# Patient Record
Sex: Female | Born: 1990
Health system: Southern US, Community
[De-identification: ages and names within clinical notes are randomized; demographics above are authoritative.]

## PROBLEM LIST (undated history)

## (undated) DIAGNOSIS — O039 Complete or unspecified spontaneous abortion without complication: Secondary | ICD-10-CM

## (undated) DIAGNOSIS — N200 Calculus of kidney: Secondary | ICD-10-CM

## (undated) DIAGNOSIS — N898 Other specified noninflammatory disorders of vagina: Secondary | ICD-10-CM

## (undated) DIAGNOSIS — O24419 Gestational diabetes mellitus in pregnancy, unspecified control: Secondary | ICD-10-CM

## (undated) DIAGNOSIS — G43009 Migraine without aura, not intractable, without status migrainosus: Secondary | ICD-10-CM

## (undated) HISTORY — DX: Complete or unspecified spontaneous abortion without complication: O03.9

## (undated) HISTORY — DX: Migraine without aura, not intractable, without status migrainosus: G43.009

## (undated) HISTORY — DX: Other specified noninflammatory disorders of vagina: N89.8

## (undated) HISTORY — PX: THYROIDECTOMY, PARTIAL: SHX18

## (undated) HISTORY — DX: Calculus of kidney: N20.0

---
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
## 2009-09-02 DIAGNOSIS — O039 Complete or unspecified spontaneous abortion without complication: Secondary | ICD-10-CM

## 2009-09-02 HISTORY — DX: Complete or unspecified spontaneous abortion without complication: O03.9

## 2011-03-26 ENCOUNTER — Ambulatory Visit: Payer: Self-pay

## 2011-04-06 ENCOUNTER — Ambulatory Visit: Payer: Self-pay

## 2011-04-09 ENCOUNTER — Observation Stay: Payer: Self-pay | Admitting: Advanced Practice Midwife

## 2011-05-10 ENCOUNTER — Inpatient Hospital Stay: Payer: Self-pay

## 2011-05-10 DIAGNOSIS — O24419 Gestational diabetes mellitus in pregnancy, unspecified control: Secondary | ICD-10-CM

## 2011-05-10 LAB — CBC WITH DIFFERENTIAL/PLATELET
Basophil #: 0 10*3/uL (ref 0.0–0.1)
Basophil %: 0.2 %
Eosinophil #: 0 10*3/uL (ref 0.0–0.7)
Eosinophil %: 0.3 %
HCT: 33.2 % — ABNORMAL LOW (ref 35.0–47.0)
HGB: 11.1 g/dL — ABNORMAL LOW (ref 12.0–16.0)
Lymphocyte #: 1.7 10*3/uL (ref 1.0–3.6)
Lymphocyte %: 14.3 %
MCH: 30.9 pg (ref 26.0–34.0)
MCHC: 33.3 g/dL (ref 32.0–36.0)
MCV: 93 fL (ref 80–100)
Monocyte #: 0.7 10*3/uL (ref 0.0–0.7)
Monocyte %: 5.6 %
Neutrophil #: 9.3 10*3/uL — ABNORMAL HIGH (ref 1.4–6.5)
Neutrophil %: 79.6 %
Platelet: 183 10*3/uL (ref 150–440)
RBC: 3.58 10*6/uL — ABNORMAL LOW (ref 3.80–5.20)
RDW: 13.3 % (ref 11.5–14.5)
WBC: 11.7 10*3/uL — ABNORMAL HIGH (ref 3.6–11.0)

## 2011-05-11 LAB — HEMATOCRIT: HCT: 29.4 % — ABNORMAL LOW (ref 35.0–47.0)

## 2011-05-15 LAB — PATHOLOGY REPORT

## 2012-04-16 ENCOUNTER — Other Ambulatory Visit: Payer: Self-pay

## 2012-04-16 LAB — CREATININE, SERUM
Creatinine: 0.85 mg/dL (ref 0.60–1.30)
EGFR (African American): >60
EGFR (Non-African Amer.): >60

## 2012-04-16 LAB — HCG, QUANTITATIVE, PREGNANCY: Beta Hcg, Quant.: <1 m[IU]/mL — ABNORMAL LOW

## 2012-04-17 ENCOUNTER — Ambulatory Visit: Payer: Self-pay | Admitting: Otolaryngology

## 2012-05-02 ENCOUNTER — Ambulatory Visit: Payer: Self-pay | Admitting: Otolaryngology

## 2012-06-25 ENCOUNTER — Ambulatory Visit: Payer: Self-pay | Admitting: Otolaryngology

## 2014-06-02 IMAGING — CT CT NECK WITH CONTRAST
2 series · 10 of 14 positions shown, 12 images · IV contrast (agent unspecified)
Comparison: none

REASON FOR EXAM: Neck mass
COMMENTS:

PROCEDURE:     CT  - CT NECK WITH CONTRAST  - April 17, 2012  [DATE]
RESULT:     History: Neck mass.
Comparison Study: No recent.

[Series 2: soft tissue · axial · 0.47mm/px · z∈[+196,+418]mm · 8 of 96 slices shown, 10 images]
[im 11/96  soft-tissue]
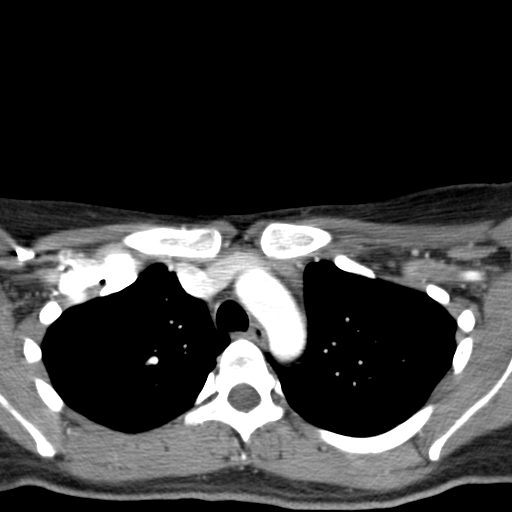
[im 11/96  bone]
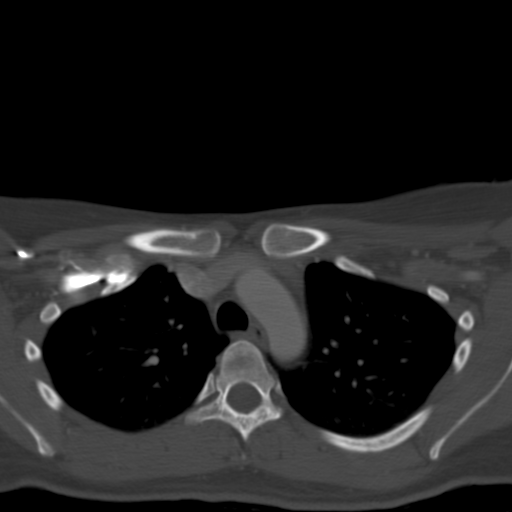
[im 22/96  bone]
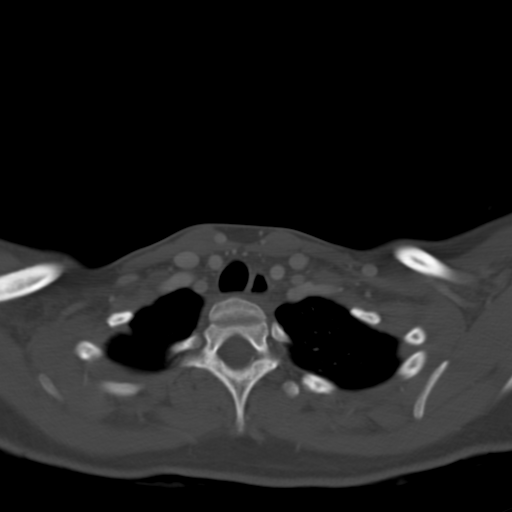
[im 32/96  bone]
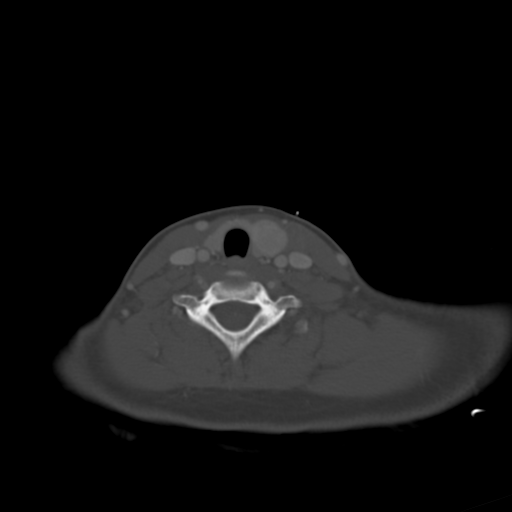
[im 43/96  bone]
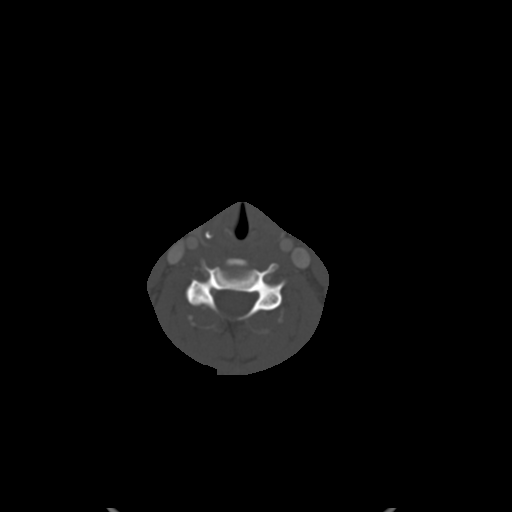
[im 53/96  soft-tissue]
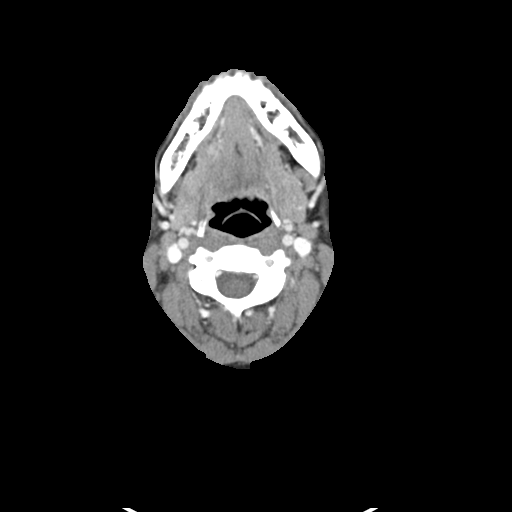
[im 53/96  bone]
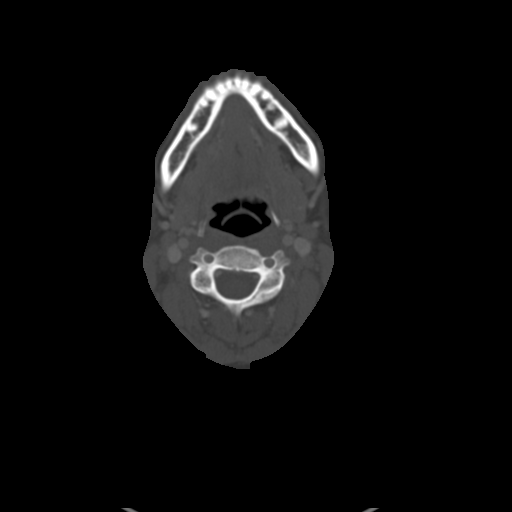
[im 64/96  bone]
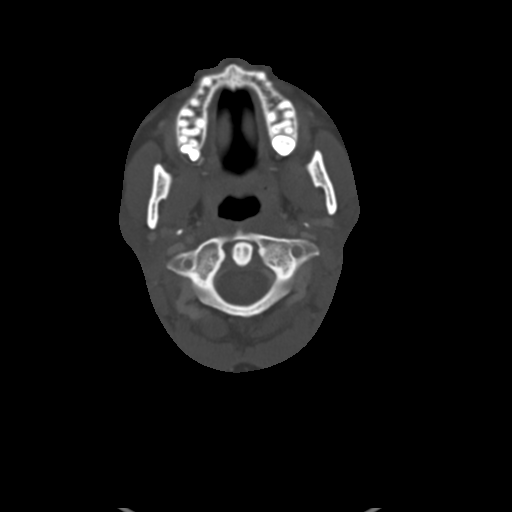
[im 74/96  bone]
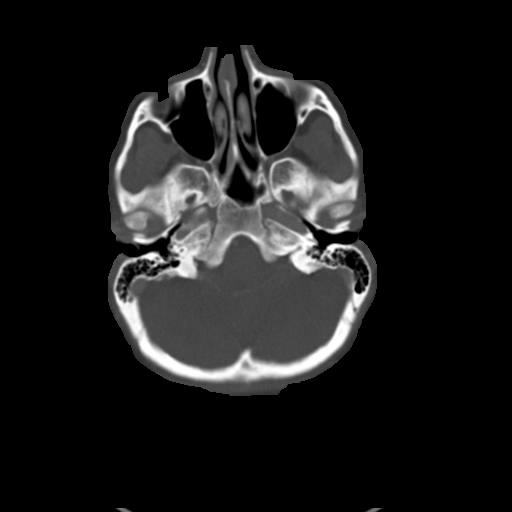
[im 85/96  bone]
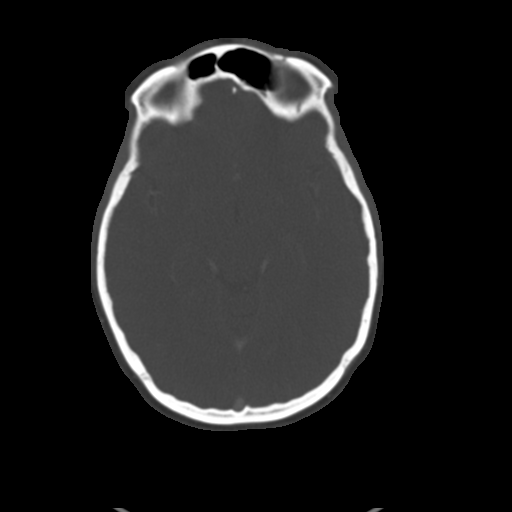

[Series 4: lung windows · axial · 0.66mm/px · z∈[+196,+230]mm · 2 of 33 slices shown]
[im 11/33  bone]
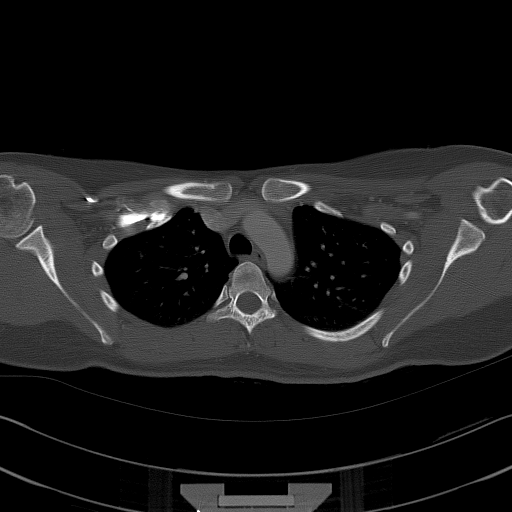
[im 22/33  bone]
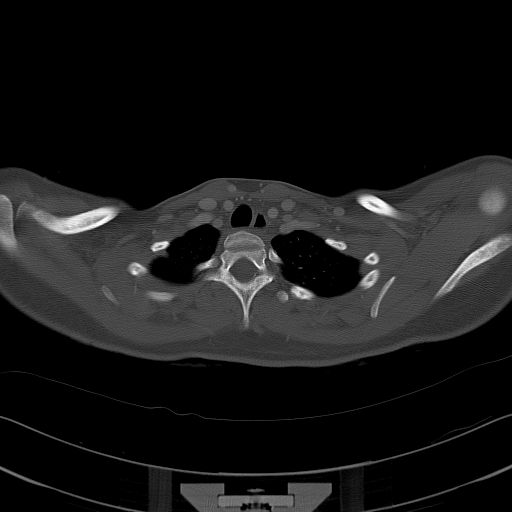

[10 of 14 positions shown; findings below may reference images not displayed]

FINDINGS: Standard CT obtained with 75 cc of Isovue 300. Thoracic aorta
unremarkable. Small shotty cervical lymph nodes noted. Salivary glands and
submandibular glands normal. Larynx is normal. A 2 cm rounded enhancing
lesion is no the region of the left lobe of the thyroid. This could be an
unusually shaped thyroid lobe or a thyroid mass and thyroid ultrasound
suggested for further evaluation. Lung apices are clear.
IMPRESSION: 2 cm thyroid lesion as described above. Thyroid ultrasound
suggested for further evaluation. Thyroid ultrasound suggested for further
evaluation. This finding is in the region of the marker at the level of the
palpable abnormality.

## 2014-06-25 NOTE — Op Note (Signed)
PATIENT NAME:  Kristin Blake, Kristin Blake MR#:  [id number] DATE OF BIRTH:  Jul 30, 1990  DATE OF PROCEDURE:  06/25/2012  PREOPERATIVE DIAGNOSIS: Left thyroid nodule.   POSTOPERATIVE DIAGNOSIS:   Left thyroid nodule.  POSTOPERATIVE DIAGNOSIS: Left hemithyroidectomy.   SURGEON:  Marion Downer, MD  FIRST ASSISTANT:   Vernie Murders, MD  ANESTHESIA: General endotracheal.   INDICATIONS: The patient is a 24 year old with a left thyroid nodule noted on ultrasound. FNA suggested benign pathology.   FINDINGS: About a 2 cm nodule involving the lower pole of the lower to mid pole of the left thyroid gland.   COMPLICATIONS: None.   DESCRIPTION OF PROCEDURE: After obtaining informed consent, the patient was taken to the Operating Room and placed in the supine position. After induction of general endotracheal anesthesia with the use of the laryngeal monitor, which was attached to the endotracheal tube and visualized after intubation to make sure electrodes were in good position, the patient was then placed on a shoulder roll with the neck extended. The skin was injected over the lower neck with 1% lidocaine with epinephrine 1:200,000.  She was then prepped and draped in the usual sterile fashion. A 15 blade was used to incise the skin and the incision carried down through the platysma with the Bovie. The dissection proceeded down to the strap muscles utilizing the Harmonic scalpel to divide fascial tissues and one of the anterior jugular veins. The strap muscles were then divided in the midline and retracted laterally. The thyroid was situated fairly high in the neck relative to the incision because of the anatomy of her neck. The incision had been made in the lower skin crease. Dissection proceeded superiorly along the inferior pole of the thyroid gland and dissecting up over the nodule, predominantly using blunt dissection, dividing some fascial attachments with the Harmonic scalpel, until the superior pole was also well  identified. The thyroid was gently pulled down using a Kittner and blunt dissection proceeded laterally around the edge of the gland. The inferior pole vessels were carefully identified and divided with the Harmonic scalpel. The inferior parathyroid gland was within this tissue and was preserved. Dissection proceeded up laterally along the thyroid gland and up to the superior pole vessels which were identified and divided with the Harmonic scalpel. The thyroid was carefully pulled down inferiorly and medially. Fascial attachments to the gland were carefully divided with the Harmonic scalpel and the area around the gland was dissected, looking carefully for the recurrent laryngeal nerve during the entire procedure to try to identify during dissection.   It was a little difficult to identify but eventually this was identified more superiorly where it was entering the larynx. It was dissected down more inferiorly to follow the length of the nerve and it was intact. The nerve was stimulated and stimulation was intermittent, which appeared to indicate potentially that the laryngeal monitor electrodes had moved likely with manipulation of the trachea during the dissection as the trachea had to be retracted medially a number of times. With the nerve under direct visualization, Berry's ligament was divided and the thyroid gland dissected away from the trachea. The superior parathyroid gland was identified and preserved during dissection of the superior pole of the thyroid gland away from fascial attachments in the region of the recurrent laryngeal nerve. The gland was then divided in the midline at the isthmus and dissected off the trachea, delivered and sent as a specimen.   The wound was irrigated with saline. The nerve was followed up  to its entrance in the larynx and down inferiorly a bit and was completely intact, but stimulation was again noted to be intermittent likely due to electrode array placement having  changed during the course of the case. I was able to palpate the back side of the larynx and feel some movement with stimulation of the nerve.  A #10 TLS drain was placed through a separate area of the skin and secured with a 3-0 Prolene. The strap muscles were reapproximated with 4-0 Vicryl suture and the platysma and subcutaneous tissues closed with 4-0 Vicryl followed by skin closure with a 3-0 Prolene suture in a running subcuticular stitch. The drain was hooked up to a Hemovac and patient returned to the anesthesiologist for awakening. She was awakened and taken to the recovery room in good condition postoperatively. Blood loss was approximately 10 mL.     ____________________________ Ollen GrossPaul S. Willeen CassBennett, MD psb:ct D: 06/25/2012 10:43:19 ET T: 06/25/2012 11:06:11 ET JOB#: [id number]  cc: Ollen GrossPaul S. Willeen CassBennett, MD, <Dictator> Sandi MealyPAUL S Clydine Parkison MD ELECTRONICALLY SIGNED 07/01/2012 7:45

## 2014-06-27 NOTE — Op Note (Signed)
PATIENT NAME:  Kristin Blake, Kristin Blake MR#:  [id number] DATE OF BIRTH:  07/21/1990  DATE OF PROCEDURE:  05/10/2011  PREOPERATIVE DIAGNOSIS: Fetal intolerance to labor.   POSTOPERATIVE DIAGNOSIS: Fetal intolerance to labor.   PROCEDURE: Primary LUT cesarean section.   SURGEON: Elliot Gurneyarrie C. Alyanna Stoermer, MD  FINDINGS: Female weighing 6 pounds 2 ounces with a very small usually shaped uterus and a female with Apgars 9 and 9.   DESCRIPTION OF PROCEDURE: Patient was taken to the Operating Room, placed in supine position. After adequate general endotracheal anesthesia was instilled, the patient was prepped and draped in the usual sterile fashion. This was done in a STAT fashion since the baby's heartbeat was decreasing into the 90s on a regular basis. Pfannenstiel skin incision was made approximately three fingerbreadths above the pubic symphysis and carried sharply down to the fascia. The fascia was nicked in the midline and the incision was extended in superolateral manner. The anterior aspect of the rectus fascia was identified, sharp and bluntly removed as was the posterior aspect. The muscle bellies were identified and split in the middle. The peritoneum was grasped and sharply entered. Bladder blade was placed. Bladder flap was created. Uterine incision was made and extended with the surgeon's fingers. The infant's head was palpated, removed from the pelvis and with blunt force the baby was removed from the uterus. The cord was clamped and cut. Cord blood was obtained. The infant was handed off to the awaiting pediatrician. Pitocin was started. The placenta was delivered. The uterus was delivered and wrapped in a moist laparotomy sponge. The interior of the uterus was curetted with a moist laparotomy sponge. Pennington's were placed along the uterine incision. A locked chromic suture and an imbricating suture were then placed and the bladder flap was tacked back up to the incision. The belly was irrigated with copious  amounts of warm normal saline. The uterus was placed back into the abdomen. Gutters were cleared. Muscle bellies were approximated with a running Vicryl suture. Kocher was used to grasp the superior aspect of the fascia. The On-Q trocars were placed. The catheters were placed through this. The trocars were removed. The catheters were coiled around the muscle. The fascial edges were closed in a running chromic suture. The pumps were primed. Suture of 4-0 Monocryl was used to approximate the skin edges. The coils of the On-Q were wrapped and placed and taped to the abdominal wall. Attention was then turned to the incision. Benzoin and Steri-Strips were placed. Clear urine was noted in the Foley bag and fundus was found to be firm as clots were expressed.   ____________________________ Elliot Gurneyarrie C. Alese Furniss, MD cck:cms D: 05/11/2011 23:23:00 ET T: 05/12/2011 11:08:00 ET JOB#: [id number]  cc: Elliot Gurneyarrie C. Deondra Wigger, MD, <Dictator> Elliot GurneyARRIE C Deone Omahoney MD ELECTRONICALLY SIGNED 05/17/2011 11:11

## 2014-07-13 NOTE — H&P (Signed)
L&D Evaluation:  History Expanded:   HPI 24 yo G1P0 at 7732 5/7 weeks, EDD of 05/30/11 per 6 week US. [facility], Gestational Diabetes with 2/4 elevated values on 3 hr GTT at 30 weeks. Pt was sent from office today for prolonged monitoring d/t variable FHR decels with otherwise reactive NST.  Labs: O+, antibody neg, RPR NR, HBsAg neg, HIV neg, R equivocal, VI, GC/CT neg    Blood Type O positive    Group B Strep Results (Result >5wks must be treated as unknown) unknown/result > 5 weeks ago     Maternal HIV Negative    Maternal Syphilis Ab Nonreactive    Maternal Varicella Immune    Rubella Results equivocal    Maternal T-Dap Immune    Patient's Surgical History laparoscopy     Medications Pre Natal Vitamins  Glyburide 2.5 mg QAM     Allergies NKDA    Social History none    Exam:   General no apparent distress    Mental Status some anxiety r/t work stress     Abdomen gravid, non-tender    Edema no edema     Pelvic deferred    Mebranes Intact    FHT Description mostly reactive with occasional variable decels, baseline 110s-120s    Ucx absent   Plan:   Comments FHR tracing reviewed with Dr Patton SallesWeaver-Lee who recommended BPP today in office & [facility]   Electronic Signatures: Vella KohlerBrothers, Yarelin Reichardt K (CNM)  (Signed 04-Feb-13 14:24)  Authored: L&D Evaluation   Last Updated: 04-Feb-13 14:24 by Vella KohlerBrothers, Raymund Manrique K (CNM)

## 2014-07-13 NOTE — H&P (Signed)
L&D Evaluation:  History:   HPI 24 yo G1P0 GDMA2 at 8037 1/7/weeks, EDD of 05/30/11 per 6 week US  sent from office for prolonged monitoring and hydration after AFI today of 6cm and NST with exaggerated variability and variable decels with unsure baseline.Shortly after presenting to L&D had fetal bradycardia to 90 x 20 min. Good scalp stim with accels to 150s and mod variability.Position changes, IV fluid, and O2 used for in utero resuscitation. This is the second observation for patient for variables.[facility], Gestational Diabetes with 2/4 elevated values on 3 hr GTT at 30 weeks. Patient's blood sugars have been controlled on glyburide 2.5 mgm in AM. Denies LOF, VB, or regular contractions. Baby has been active. Labs: O+, antibody neg, RPR NR, HBsAg neg, HIV neg, R equivocal, VI, GC/CT neg, GBS negative.    Presents with variable decels    Patient's Medical History GDMA2, kidney stones    Patient's Surgical History laparoscopy    Medications Pre Natal Vitamins  Glyburide 2.5 mg QAM    Allergies NKDA    Social History none    Family History Non-Contributory   ROS:   ROS see HPI   Exam:   Vital Signs stable    General no apparent distress    Mental Status clear    Chest clear    Heart normal sinus rhythm, no murmur/gallop/rubs    Abdomen gravid, non-tender    Estimated Fetal Weight Average for gestational age    Edema no edema    Reflexes 1+    Pelvic no external lesions, 1/75%/-1    Mebranes Intact    FHT bradycardia to 90 x 20 min with mod variability, now baseline 100 with decels to 90s    Ucx mild-uterine irritability    Skin dry   Impression:   Impression IUP at 37 1/7 weeks with nonreassuring FHR tracing.   Plan:   Plan Consulted Dr Janene HarveyKlett for C-section-and she agrees with POM.   LTCS Consent: I have had an exceedingly long and careful discussion with this patient about her circumstances and options available. She  understands that I have carefully evaluated the infant's fetal heart rate pattern, the probable time remaining in her labor and her reserve. We are at a point where one of them will need to take a potential risk, either she take the risk of a cesarean section or the infant take a risk that the fetal intolerance to labor is very significant with the potential for a real effect on the baby which will worsen if we allow labor to continue.  She also understands that interpretation of the FHT is not a precise science and that someone else might allow labor to continue for the present. Mutual decision made to proceed with abdominal surgery.  Fully informed consent obtained including the risks of anesthesia, hemorrhage, infection, injury to adjacent structures, bowel, bladder, and blood vessels..  Electronic Signatures: Trinna BalloonGutierrez, Akeya Ryther L (CNM)  (Signed 07-Mar-13 17:58)  Authored: L&D Evaluation, Consent   Last Updated: 07-Mar-13 17:58 by Trinna BalloonGutierrez, Mikele Sifuentes L (CNM)

## 2016-03-10 ENCOUNTER — Emergency Department (HOSPITAL_COMMUNITY)
Admission: EM | Admit: 2016-03-10 | Discharge: 2016-03-10 | Disposition: A | Discharge status: Home / Self Care | Payer: 59 | Attending: Emergency Medicine | Admitting: Emergency Medicine

## 2016-03-10 ENCOUNTER — Encounter (HOSPITAL_COMMUNITY): Payer: Self-pay | Admitting: *Deleted

## 2016-03-10 DIAGNOSIS — E039 Hypothyroidism, unspecified: Secondary | ICD-10-CM | POA: Diagnosis not present

## 2016-03-10 DIAGNOSIS — R55 Syncope and collapse: Secondary | ICD-10-CM | POA: Insufficient documentation

## 2016-03-10 HISTORY — DX: Gestational diabetes mellitus in pregnancy, unspecified control: O24.419

## 2016-03-10 LAB — CBC WITH DIFFERENTIAL/PLATELET
BASOS PCT: 0 %
Basophils Absolute: 0 10*3/uL (ref 0.0–0.1)
Eosinophils Absolute: 0.1 10*3/uL (ref 0.0–0.7)
Eosinophils Relative: 2 %
HCT: 41.7 % (ref 36.0–46.0)
Hemoglobin: 14.1 g/dL (ref 12.0–15.0)
Lymphocytes Relative: 46 %
Lymphs Abs: 2.8 10*3/uL (ref 0.7–4.0)
MCH: 31.8 pg (ref 26.0–34.0)
MCHC: 33.8 g/dL (ref 30.0–36.0)
MCV: 94.1 fL (ref 78.0–100.0)
Monocytes Absolute: 0.5 10*3/uL (ref 0.1–1.0)
Monocytes Relative: 7 %
Neutro Abs: 2.8 10*3/uL (ref 1.7–7.7)
Neutrophils Relative %: 45 %
Platelets: 271 10*3/uL (ref 150–400)
RBC: 4.43 MIL/uL (ref 3.87–5.11)
RDW: 12.2 % (ref 11.5–15.5)
WBC: 6.2 10*3/uL (ref 4.0–10.5)

## 2016-03-10 LAB — TSH: TSH: 6.046 u[IU]/mL — ABNORMAL HIGH (ref 0.350–4.500)

## 2016-03-10 LAB — COMPREHENSIVE METABOLIC PANEL
ALBUMIN: 4.3 g/dL (ref 3.5–5.0)
ALT: 12 U/L — ABNORMAL LOW (ref 14–54)
AST: 17 U/L (ref 15–41)
Alkaline Phosphatase: 69 U/L (ref 38–126)
Anion gap: 7 (ref 5–15)
BILIRUBIN TOTAL: 0.3 mg/dL (ref 0.3–1.2)
BUN: 11 mg/dL (ref 6–20)
CO2: 25 mmol/L (ref 22–32)
Calcium: 9.9 mg/dL (ref 8.9–10.3)
Chloride: 103 mmol/L (ref 101–111)
Creatinine, Ser: 0.91 mg/dL (ref 0.44–1.00)
GFR calc Af Amer: >60 mL/min (ref >60)
GFR calc non Af Amer: >60 mL/min (ref >60)
Glucose, Bld: 87 mg/dL (ref 65–99)
POTASSIUM: 3.6 mmol/L (ref 3.5–5.1)
Sodium: 135 mmol/L (ref 135–145)
Total Protein: 8.1 g/dL (ref 6.5–8.1)

## 2016-03-10 LAB — I-STAT BETA HCG BLOOD, ED (MC, WL, AP ONLY): I-stat hCG, quantitative: <5 m[IU]/mL (ref <5)

## 2016-03-10 MED ORDER — SODIUM CHLORIDE 0.9 % IV SOLN
INTRAVENOUS | Status: DC
  Administered 2016-03-10: 09:00:00 via INTRAVENOUS

## 2016-03-10 MED ORDER — SODIUM CHLORIDE 0.9 % IV BOLUS (SEPSIS)
1000.0000 mL | Freq: Once | INTRAVENOUS | Status: AC
Start: 2016-03-10 — End: 2016-03-10
  Administered 2016-03-10: 1000 mL via INTRAVENOUS

## 2016-03-10 NOTE — ED Triage Notes (Addendum)
Pt reports she has been having intermittent episodes with feeling very hot and flushed as if she is going to pass out and then her heart rate drops into the 40's and then increases to 150. Pt reports headache after these episodes. Pt reads her heart rate off her watch. Pt reports dizziness upon standing. Denies SOB.

## 2016-03-10 NOTE — ED Notes (Signed)
Pt states that her IV was uncomfortable in her arm.  Assessed IV and changed position.  Pt states that it is comfortable at this time.

## 2016-03-10 NOTE — ED Provider Notes (Signed)
[facility]-EMERGENCY DEPT Provider Note   CSN: [id number] Arrival date & time: 03/10/16  4098   By signing my name below, I, Cynda Acres, attest that this documentation has been prepared under the direction and in the presence of Vanetta Mulders, MD. Electronically Signed: Cynda Acres, Scribe. 03/10/16. 9:06 AM.   History   Chief Complaint Chief Complaint  Patient presents with  . Near Syncope    HPI Comments: Kristin Blake is a 26 y.o. female with a history of gestational diabetes, who presents to the Emergency Department complaining of sudden-onset, intermittent near syncopal episodes that began 4 days ago. Patient has associated low heart rate, hot/cold flashes, nausea, ligjht-headedness, dizziness, and headache. Patient states she has 2 episodes a day. She reports having to pull over on the side of the road because she did not feel well to drive. No modifying factors indicated. She denies any  Fever, chills, cold symptoms, visual changes, shortness of breath, abdominal pain, urinary symptoms, numbness, or weakness.   The history is provided by the patient. No language interpreter was used.    Past Medical History:  Diagnosis Date  . Gestational diabetes     There are no active problems to display for this patient.   Past Surgical History:  Procedure Laterality Date  . CESAREAN SECTION    . THYROIDECTOMY, PARTIAL      OB History    No data available       Home Medications    Prior to Admission medications   Medication Sig Start Date End Date Taking? Authorizing Provider  cetirizine (ZYRTEC ALLERGY) 10 MG tablet Take 10 mg by mouth daily.   Yes Historical Provider, MD  SPRINTEC 28 0.25-35 MG-MCG tablet  02/17/16  Yes Historical Provider, MD    Family History No family history on file.  Social History Social History  Substance Use Topics  . Smoking status: Never Smoker  . Smokeless tobacco: Never Used  . Alcohol use Yes     Comment: socially      Allergies   Patient has no known allergies.   Review of Systems Review of Systems  Constitutional: Negative for chills and fever.  HENT: Negative for congestion, rhinorrhea and sore throat.   Eyes: Negative for visual disturbance.  Respiratory: Negative for shortness of breath.   Gastrointestinal: Positive for nausea. Negative for abdominal pain, diarrhea and vomiting.  Genitourinary: Negative for dysuria and hematuria.  Musculoskeletal: Negative for joint swelling.  Skin: Negative for rash.  Neurological: Positive for light-headedness and headaches. Negative for weakness and numbness.  Hematological: Does not bruise/bleed easily.  Psychiatric/Behavioral: Negative for confusion.     Physical Exam Updated Vital Signs BP 129/87 (BP Location: Left Arm)   Pulse 99   Temp 98.2 F (36.8 C) (Oral)   Resp 19   Ht 5\' 2"  (1.575 m)   Wt 58.1 kg   LMP 02/08/2016   SpO2 100%   BMI 23.41 kg/m   Physical Exam  Constitutional: She is oriented to person, place, and time. She appears well-developed and well-nourished.  Eyes: Pupils are equal, round, and reactive to light. No scleral icterus.  Neck: Normal range of motion. Neck supple.  Surgical scar at the base of the neck anteriorly.   Cardiovascular: Normal rate, regular rhythm and normal heart sounds.   Pulmonary/Chest: Effort normal and breath sounds normal.  Abdominal: Bowel sounds are normal. She exhibits no distension. There is no tenderness.  Musculoskeletal: Normal range of motion.  Neurological: She is alert and  oriented to person, place, and time. No cranial nerve deficit or sensory deficit. She exhibits normal muscle tone. Coordination normal.     ED Treatments / Results  DIAGNOSTIC STUDIES: Oxygen Saturation is 100% on RA, normal by my interpretation.    COORDINATION OF CARE: 9:06 AM Discussed treatment plan with pt at bedside and pt agreed to plan.  Labs (all labs ordered are listed, but only abnormal  results are displayed) Labs Reviewed  COMPREHENSIVE METABOLIC PANEL - Abnormal; Notable for the following:       Result Value   ALT 12 (*)    All other components within normal limits  TSH - Abnormal; Notable for the following:    TSH 6.046 (*)    All other components within normal limits  CBC WITH DIFFERENTIAL/PLATELET  I-STAT BETA HCG BLOOD, ED ([facility], [facility], [facility] ONLY)    EKG  EKG Interpretation  Date/Time:  Saturday March 10 2016 08:43:51 EST Ventricular Rate:  109 PR Interval:    QRS Duration: 88 QT Interval:  319 QTC Calculation: 430 R Axis:   63 Text Interpretation:  Sinus tachycardia No previous ECGs available Confirmed by Shatia Sindoni  MD, Khya Halls ([id number]) on 03/10/2016 8:52:18 AM       Radiology No results found.  Procedures Procedures (including critical care time)  Medications Ordered in ED Medications  0.9 %  sodium chloride infusion ( Intravenous New Bag/Given 03/10/16 0915)  sodium chloride 0.9 % bolus 1,000 mL (0 mLs Intravenous Stopped 03/10/16 1037)     Initial Impression / Assessment and Plan / ED Course  I have reviewed the triage vital signs and the nursing notes.  Pertinent labs & imaging results that were available during my care of the patient were reviewed by me and considered in my medical decision making (see chart for details).  Clinical Course     A cardiac monitoring and lab workup here suggestive of hypothyroidism. No significant arrhythmias while being monitored. Patient asymptomatic here. Symptoms most likely related to hypothyroidism secondary to partial thyroidectomy. Patient has primary care doctor to follow-up with may require being started on Synthroid. Patient given work note.  Final Clinical Impressions(s) / ED Diagnoses   Final diagnoses:  Near syncope  Hypothyroidism, unspecified type    New Prescriptions New Prescriptions   No medications on file   I personally performed the services described in this documentation, which was  scribed in my presence. The recorded information has been reviewed and is accurate.        Vanetta MuldersScott Naszir Cott, MD 03/10/16 1244

## 2016-03-10 NOTE — Discharge Instructions (Signed)
Thyroid-stimulating hormone level today suggestive of hypothyroidism. Recommend close in early follow-up with primary care doctor. May require Synthroid. Rest of workup negative. Work note provided. Return for any new or worse symptoms.

## 2016-03-12 DIAGNOSIS — E039 Hypothyroidism, unspecified: Secondary | ICD-10-CM | POA: Diagnosis not present

## 2016-03-12 DIAGNOSIS — R000 Tachycardia, unspecified: Secondary | ICD-10-CM | POA: Diagnosis not present

## 2016-03-12 DIAGNOSIS — R55 Syncope and collapse: Secondary | ICD-10-CM | POA: Diagnosis not present

## 2016-03-12 DIAGNOSIS — Z6823 Body mass index (BMI) 23.0-23.9, adult: Secondary | ICD-10-CM | POA: Diagnosis not present

## 2016-04-23 DIAGNOSIS — Z6823 Body mass index (BMI) 23.0-23.9, adult: Secondary | ICD-10-CM | POA: Diagnosis not present

## 2016-04-23 DIAGNOSIS — R946 Abnormal results of thyroid function studies: Secondary | ICD-10-CM | POA: Diagnosis not present

## 2016-04-23 DIAGNOSIS — E039 Hypothyroidism, unspecified: Secondary | ICD-10-CM | POA: Diagnosis not present

## 2016-04-23 DIAGNOSIS — Z0000 Encounter for general adult medical examination without abnormal findings: Secondary | ICD-10-CM | POA: Diagnosis not present

## 2016-05-09 DIAGNOSIS — J018 Other acute sinusitis: Secondary | ICD-10-CM | POA: Diagnosis not present

## 2016-05-09 DIAGNOSIS — Z6823 Body mass index (BMI) 23.0-23.9, adult: Secondary | ICD-10-CM | POA: Diagnosis not present

## 2016-07-25 ENCOUNTER — Ambulatory Visit (INDEPENDENT_AMBULATORY_CARE_PROVIDER_SITE_OTHER): Payer: 59 | Admitting: Obstetrics and Gynecology

## 2016-07-25 ENCOUNTER — Encounter: Payer: Self-pay | Admitting: Obstetrics and Gynecology

## 2016-07-25 VITALS — BP 120/80 | HR 83 | Ht 62.0 in | Wt 125.0 lb

## 2016-07-25 DIAGNOSIS — Z01419 Encounter for gynecological examination (general) (routine) without abnormal findings: Secondary | ICD-10-CM | POA: Diagnosis not present

## 2016-07-25 DIAGNOSIS — Z3041 Encounter for surveillance of contraceptive pills: Secondary | ICD-10-CM

## 2016-07-25 DIAGNOSIS — Z124 Encounter for screening for malignant neoplasm of cervix: Secondary | ICD-10-CM

## 2016-07-25 MED ORDER — LEVONORGESTREL-ETHINYL ESTRAD 0.1-20 MG-MCG PO TABS: 1.0000 | ORAL_TABLET | Freq: Every day | ORAL | 12 refills | Status: DC

## 2016-07-25 NOTE — Progress Notes (Signed)
Chief Complaint  Patient presents with  . Gynecologic Exam     HPI:      Ms. Kristin Blake is a 26 y.o. No obstetric history on file. who LMP was Patient's last menstrual period was 06/27/2016., presents today for her annual examination.  Her menses are regular every 28-30 days, lasting 5 days.  Dysmenorrhea none. She has had intermenstrual bleeding the past 2 pill packs, with possibly late pills. She has noticed nausea and increased migraine headaches without aura the 3rd wk of OCPs. She likes pills but is interested in trying different kind.  Sex activity: single partner, contraception - OCP (estrogen/progesterone).  Last Pap: Jul 16, 2014  Results were: no abnormalities /neg HPV DNA  Hx of STDs: none  There is no FH of breast cancer. There is no FH of ovarian cancer. The patient does do self-breast exams.  Tobacco use: The patient denies current or previous tobacco use. Alcohol use: social drinker Exercise: moderately active  She does get adequate calcium and Vitamin D in her diet.    Past Medical History:  Diagnosis Date  . Gestational diabetes   . Kidney stone   . Migraine headache without aura     Past Surgical History:  Procedure Laterality Date  . CESAREAN SECTION    . LAPAROSCOPY  2000  . THYROIDECTOMY, PARTIAL      Family History  Problem Relation Age of Onset  . Cancer Maternal Grandmother   . Cancer Paternal Grandfather     Social History   Social History  . Marital status: Married    Spouse name: N/A  . Number of children: N/A  . Years of education: N/A   Occupational History  . Not on file.   Social History Main Topics  . Smoking status: Never Smoker  . Smokeless tobacco: Never Used  . Alcohol use Yes     Comment: socially  . Drug use: No  . Sexual activity: Not on file   Other Topics Concern  . Not on file   Social History Narrative  . No narrative on file     Current Outpatient Prescriptions:  .  cetirizine (ZYRTEC  ALLERGY) 10 MG tablet, Take 10 mg by mouth daily., Disp: , Rfl:  .  levonorgestrel-ethinyl estradiol (AVIANE) 0.1-20 MG-MCG tablet, Take 1 tablet by mouth daily., Disp: 28 tablet, Rfl: 12  ROS:  Review of Systems  Constitutional: Negative for fatigue, fever and unexpected weight change.  Respiratory: Negative for cough, shortness of breath and wheezing.   Cardiovascular: Negative for chest pain, palpitations and leg swelling.  Gastrointestinal: Negative for blood in stool, constipation, diarrhea, nausea and vomiting.  Endocrine: Negative for cold intolerance, heat intolerance and polyuria.  Genitourinary: Negative for dyspareunia, dysuria, flank pain, frequency, genital sores, hematuria, menstrual problem, pelvic pain, urgency, vaginal bleeding, vaginal discharge and vaginal pain.  Musculoskeletal: Negative for back pain, joint swelling and myalgias.  Skin: Negative for rash.  Neurological: Negative for dizziness, syncope, light-headedness, numbness and headaches.  Hematological: Negative for adenopathy.  Psychiatric/Behavioral: Negative for agitation, confusion, sleep disturbance and suicidal ideas. The patient is not nervous/anxious.      Objective: BP 120/80   Pulse 83   Ht 5\' 2"  (1.575 m)   Wt 125 lb (56.7 kg)   LMP 06/27/2016   BMI 22.86 kg/m    Physical Exam  Constitutional: She is oriented to person, place, and time. She appears well-developed and well-nourished.  Genitourinary: Vagina normal and uterus normal. There is no  rash or tenderness on the right labia. There is no rash or tenderness on the left labia. No erythema or tenderness in the vagina. No vaginal discharge found. Right adnexum does not display mass and does not display tenderness. Left adnexum does not display mass and does not display tenderness. Cervix does not exhibit motion tenderness or polyp. Uterus is not enlarged or tender.  Neck: Normal range of motion. No thyromegaly present.  Cardiovascular: Normal  rate, regular rhythm and normal heart sounds.   No murmur heard. Pulmonary/Chest: Effort normal and breath sounds normal. Right breast exhibits no mass, no nipple discharge, no skin change and no tenderness. Left breast exhibits no mass, no nipple discharge, no skin change and no tenderness.  Abdominal: Soft. There is no tenderness. There is no guarding.  Musculoskeletal: Normal range of motion.  Neurological: She is alert and oriented to person, place, and time. No cranial nerve deficit.  Psychiatric: She has a normal mood and affect. Her behavior is normal.  Vitals reviewed.   Assessment/Plan: Encounter for annual routine gynecological examination  Cervical cancer screening - Plan: IGP, rfx Aptima HPV ASCU  Encounter for surveillance of contraceptive pills - OCP change to aviane due to side effects. Rx eRxd. F/u prn. - Plan: levonorgestrel-ethinyl estradiol (AVIANE) 0.1-20 MG-MCG tablet             GYN counsel use and side effects of OCP's, adequate intake of calcium and vitamin D     F/U  Return in about 1 year (around 07/25/2017).  Alicia B. Copland, PA-C 07/25/2016 2:56 PM

## 2016-07-27 DIAGNOSIS — R002 Palpitations: Secondary | ICD-10-CM | POA: Diagnosis not present

## 2016-07-27 LAB — IGP, RFX APTIMA HPV ASCU: PAP Smear Comment: 0

## 2016-08-27 DIAGNOSIS — Z6822 Body mass index (BMI) 22.0-22.9, adult: Secondary | ICD-10-CM | POA: Diagnosis not present

## 2016-12-15 DIAGNOSIS — J020 Streptococcal pharyngitis: Secondary | ICD-10-CM | POA: Diagnosis not present

## 2017-01-10 DIAGNOSIS — R3915 Urgency of urination: Secondary | ICD-10-CM | POA: Diagnosis not present

## 2017-01-10 DIAGNOSIS — Z0000 Encounter for general adult medical examination without abnormal findings: Secondary | ICD-10-CM | POA: Diagnosis not present

## 2017-04-19 DIAGNOSIS — Z0000 Encounter for general adult medical examination without abnormal findings: Secondary | ICD-10-CM | POA: Diagnosis not present

## 2017-04-19 DIAGNOSIS — R3915 Urgency of urination: Secondary | ICD-10-CM | POA: Diagnosis not present

## 2017-04-23 DIAGNOSIS — Z9089 Acquired absence of other organs: Secondary | ICD-10-CM | POA: Diagnosis not present

## 2017-07-15 ENCOUNTER — Other Ambulatory Visit: Payer: Self-pay

## 2017-07-15 DIAGNOSIS — Z3041 Encounter for surveillance of contraceptive pills: Secondary | ICD-10-CM

## 2017-07-15 MED ORDER — LEVONORGESTREL-ETHINYL ESTRAD 0.1-20 MG-MCG PO TABS: 1.0000 | ORAL_TABLET | Freq: Every day | ORAL | 0 refills | Status: DC

## 2017-08-14 ENCOUNTER — Encounter: Payer: Self-pay | Admitting: Obstetrics and Gynecology

## 2017-08-14 ENCOUNTER — Ambulatory Visit (INDEPENDENT_AMBULATORY_CARE_PROVIDER_SITE_OTHER): Payer: 59 | Admitting: Obstetrics and Gynecology

## 2017-08-14 VITALS — BP 110/80 | HR 78 | Ht 62.0 in | Wt 136.0 lb

## 2017-08-14 DIAGNOSIS — Z3041 Encounter for surveillance of contraceptive pills: Secondary | ICD-10-CM | POA: Diagnosis not present

## 2017-08-14 DIAGNOSIS — Z01419 Encounter for gynecological examination (general) (routine) without abnormal findings: Secondary | ICD-10-CM | POA: Diagnosis not present

## 2017-08-14 DIAGNOSIS — Z124 Encounter for screening for malignant neoplasm of cervix: Secondary | ICD-10-CM

## 2017-08-14 DIAGNOSIS — G43019 Migraine without aura, intractable, without status migrainosus: Secondary | ICD-10-CM | POA: Diagnosis not present

## 2017-08-14 DIAGNOSIS — G43009 Migraine without aura, not intractable, without status migrainosus: Secondary | ICD-10-CM | POA: Insufficient documentation

## 2017-08-14 MED ORDER — LEVONORGESTREL-ETHINYL ESTRAD 0.1-20 MG-MCG PO TABS: 1.0000 | ORAL_TABLET | Freq: Every day | ORAL | 4 refills | Status: DC

## 2017-08-14 NOTE — Patient Instructions (Signed)
I value your feedback and entrusting us with your care. If you get a [facility] patient survey, I would appreciate you taking the time to let us know about your experience today. Thank you! 

## 2017-08-14 NOTE — Progress Notes (Signed)
Chief Complaint  Patient presents with  . Gynecologic Exam    want a new bc - considering IUD but scared    HPI:      Ms. Kristin Blake is a 27 y.o. G2P1011 who LMP was Patient's last menstrual period was 08/13/2017., presents today for her annual examination. Her menses are regular every 28-30 days, lasting 5-7 days. Dysmenorrhea mild, improved with NSAIDs and heat pad. She has not had intermenstrual bleeding. Hx of migraine headaches without aura on OCPs. Tried cont dosing a couple times this yr with sx control. Changed OCPs last and doing well with lower hormone dose.   Sex activity: single partner, contraception - OCP (estrogen/progesterone).  May want IUD. Last Pap: Jul 25, 2016 Results were: no abnormalities  Hx of STDs: none  There is no FH of breast cancer. There is no FH of ovarian cancer. The patient does do self-breast exams.  Tobacco use: The patient denies current or previous tobacco use. Alcohol use: none Exercise: moderately active  She does get adequate calcium and Vitamin D in her diet.   Past Medical History:  Diagnosis Date  . Gestational diabetes   . Kidney stone   . Migraine headache without aura   . Miscarriage 09/2009   7WKS  . Vaginal lesion 1994    Past Surgical History:  Procedure Laterality Date  . CESAREAN SECTION    . LAPAROSCOPY  2000  . THYROIDECTOMY, PARTIAL      Family History  Problem Relation Age of Onset  . Cancer Maternal Grandmother        ESOPHAGEAL  . Cancer Paternal Grandfather        liver, lungs (agent orange)  . Hyperlipidemia Father     Social History   Socioeconomic History  . Marital status: Married    Spouse name: Not on file  . Number of children: Not on file  . Years of education: Not on file  . Highest education level: Not on file  Occupational History  . Not on file  Social Needs  . Financial resource strain: Not on file  . Food insecurity:    Worry: Not on file    Inability: Not on file    . Transportation needs:    Medical: Not on file    Non-medical: Not on file  Tobacco Use  . Smoking status: Never Smoker  . Smokeless tobacco: Never Used  . Tobacco comment: STOPPED AGE 24  Substance and Sexual Activity  . Alcohol use: Yes    Comment: socially  . Drug use: No  . Sexual activity: Yes    Birth control/protection: Pill  Lifestyle  . Physical activity:    Days per week: Not on file    Minutes per session: Not on file  . Stress: Not on file  Relationships  . Social connections:    Talks on phone: Not on file    Gets together: Not on file    Attends religious service: Not on file    Active member of club or organization: Not on file    Attends meetings of clubs or organizations: Not on file    Relationship status: Not on file  . Intimate partner violence:    Fear of current or ex partner: Not on file    Emotionally abused: Not on file    Physically abused: Not on file    Forced sexual activity: Not on file  Other Topics Concern  . Not on file  Social  History Narrative  . Not on file    Current Outpatient Medications on File Prior to Visit  Medication Sig Dispense Refill  . ALPRAZolam (XANAX) 0.25 MG tablet Take 0.25 mg by mouth at bedtime as needed for anxiety.    . cetirizine (ZYRTEC ALLERGY) 10 MG tablet Take 10 mg by mouth daily.    Marland Kitchen escitalopram (LEXAPRO) 10 MG tablet Take 1 tablet by mouth daily.     No current facility-administered medications on file prior to visit.       ROS:  Review of Systems  Constitutional: Negative for fatigue, fever and unexpected weight change.  Respiratory: Negative for cough, shortness of breath and wheezing.   Cardiovascular: Negative for chest pain, palpitations and leg swelling.  Gastrointestinal: Negative for blood in stool, constipation, diarrhea, nausea and vomiting.  Endocrine: Negative for cold intolerance, heat intolerance and polyuria.  Genitourinary: Negative for dyspareunia, dysuria, flank pain,  frequency, genital sores, hematuria, menstrual problem, pelvic pain, urgency, vaginal bleeding, vaginal discharge and vaginal pain.  Musculoskeletal: Negative for back pain, joint swelling and myalgias.  Skin: Negative for rash.  Neurological: Negative for dizziness, syncope, light-headedness, numbness and headaches.  Hematological: Negative for adenopathy.  Psychiatric/Behavioral: Negative for agitation, confusion, sleep disturbance and suicidal ideas. The patient is not nervous/anxious.      Objective: BP 110/80   Pulse 78   Ht 5\' 2"  (1.575 m)   Wt 136 lb (61.7 kg)   LMP 08/13/2017   BMI 24.87 kg/m    Physical Exam  Constitutional: She is oriented to person, place, and time. She appears well-developed and well-nourished.  Genitourinary: Vagina normal and uterus normal. There is no rash or tenderness on the right labia. There is no rash or tenderness on the left labia. No erythema or tenderness in the vagina. No vaginal discharge found. Right adnexum does not display mass and does not display tenderness. Left adnexum does not display mass and does not display tenderness. Cervix does not exhibit motion tenderness or polyp. Uterus is not enlarged or tender.  Neck: Normal range of motion. No thyromegaly present.  Cardiovascular: Normal rate, regular rhythm and normal heart sounds.  No murmur heard. Pulmonary/Chest: Effort normal and breath sounds normal. Right breast exhibits no mass, no nipple discharge, no skin change and no tenderness. Left breast exhibits no mass, no nipple discharge, no skin change and no tenderness.  Abdominal: Soft. There is no tenderness. There is no guarding.  Musculoskeletal: Normal range of motion.  Neurological: She is alert and oriented to person, place, and time. No cranial nerve deficit.  Psychiatric: She has a normal mood and affect. Her behavior is normal.  Vitals reviewed.   Assessment/Plan: Encounter for annual routine gynecological  examination  Cervical cancer screening - Plan: IGP, Aptima HPV  Encounter for surveillance of contraceptive pills - OCP RF. Will do cont dosing for migraines. F/u prn.  - Plan: levonorgestrel-ethinyl estradiol (AVIANE) 0.1-20 MG-MCG tablet  Intractable migraine without aura and without status migrainosus - Discussed IUD and not being able to prevent headaches with cycles. Pt may want to conceive in future too. Wants to cont OCPs for now.   Meds ordered this encounter  Medications  . levonorgestrel-ethinyl estradiol (AVIANE) 0.1-20 MG-MCG tablet    Sig: Take 1 tablet by mouth daily. CONTINUOUS DOSING FOR HEADACHES    Dispense:  84 tablet    Refill:  4    Order Specific Question:   Supervising Provider    Answer:   Nadara Mustard [[id number]]  GYN counsel family planning choices, adequate intake of calcium and vitamin D, diet and exercise     F/U  Return in about 1 year (around 08/15/2018).  Lachina Salsberry B. Aleene Swanner, PA-C 08/14/2017 5:07 PM

## 2017-08-18 LAB — IGP, APTIMA HPV
HPV Aptima: NEGATIVE
PAP Smear Comment: 0

## 2017-12-07 DIAGNOSIS — Z0000 Encounter for general adult medical examination without abnormal findings: Secondary | ICD-10-CM | POA: Diagnosis not present

## 2017-12-07 DIAGNOSIS — Z111 Encounter for screening for respiratory tuberculosis: Secondary | ICD-10-CM | POA: Diagnosis not present

## 2017-12-11 DIAGNOSIS — Z0000 Encounter for general adult medical examination without abnormal findings: Secondary | ICD-10-CM | POA: Diagnosis not present

## 2017-12-11 DIAGNOSIS — Z9089 Acquired absence of other organs: Secondary | ICD-10-CM | POA: Diagnosis not present

## 2017-12-11 DIAGNOSIS — E782 Mixed hyperlipidemia: Secondary | ICD-10-CM | POA: Diagnosis not present

## 2018-08-19 ENCOUNTER — Telehealth: Payer: Self-pay | Admitting: Radiology

## 2018-08-19 NOTE — Telephone Encounter (Signed)
Left message for patient to call [facility] to schedule New Gyn/ Annual exam appointment

## 2019-01-07 DIAGNOSIS — Z6824 Body mass index (BMI) 24.0-24.9, adult: Secondary | ICD-10-CM | POA: Diagnosis not present

## 2019-01-07 DIAGNOSIS — E669 Obesity, unspecified: Secondary | ICD-10-CM | POA: Diagnosis not present

## 2019-01-07 DIAGNOSIS — E079 Disorder of thyroid, unspecified: Secondary | ICD-10-CM | POA: Diagnosis not present

## 2019-01-07 DIAGNOSIS — R7301 Impaired fasting glucose: Secondary | ICD-10-CM | POA: Diagnosis not present

## 2019-01-07 DIAGNOSIS — Z6831 Body mass index (BMI) 31.0-31.9, adult: Secondary | ICD-10-CM | POA: Diagnosis not present

## 2019-01-07 DIAGNOSIS — Z9089 Acquired absence of other organs: Secondary | ICD-10-CM | POA: Diagnosis not present

## 2019-01-07 DIAGNOSIS — F339 Major depressive disorder, recurrent, unspecified: Secondary | ICD-10-CM | POA: Diagnosis not present

## 2019-01-07 DIAGNOSIS — Z0000 Encounter for general adult medical examination without abnormal findings: Secondary | ICD-10-CM | POA: Diagnosis not present

## 2019-01-07 DIAGNOSIS — J302 Other seasonal allergic rhinitis: Secondary | ICD-10-CM | POA: Diagnosis not present

## 2019-01-07 DIAGNOSIS — E782 Mixed hyperlipidemia: Secondary | ICD-10-CM | POA: Diagnosis not present

## 2019-01-07 DIAGNOSIS — R3915 Urgency of urination: Secondary | ICD-10-CM | POA: Diagnosis not present

## 2019-01-07 DIAGNOSIS — F419 Anxiety disorder, unspecified: Secondary | ICD-10-CM | POA: Diagnosis not present

## 2019-01-07 DIAGNOSIS — F321 Major depressive disorder, single episode, moderate: Secondary | ICD-10-CM | POA: Diagnosis not present

## 2019-02-06 DIAGNOSIS — F321 Major depressive disorder, single episode, moderate: Secondary | ICD-10-CM | POA: Diagnosis not present

## 2019-02-06 DIAGNOSIS — E663 Overweight: Secondary | ICD-10-CM | POA: Diagnosis not present

## 2019-02-06 DIAGNOSIS — E119 Type 2 diabetes mellitus without complications: Secondary | ICD-10-CM | POA: Diagnosis not present

## 2019-02-06 DIAGNOSIS — F419 Anxiety disorder, unspecified: Secondary | ICD-10-CM | POA: Diagnosis not present

## 2019-02-06 DIAGNOSIS — Z6829 Body mass index (BMI) 29.0-29.9, adult: Secondary | ICD-10-CM | POA: Diagnosis not present

## 2019-04-06 MED FILL — LEVONOR-ETH ESTRAD 0.1-0.02: 0.1-20 | 56 days supply | Qty: 56 | Fill #0 | Status: TO

## 2019-04-06 MED FILL — PHENTERMINE 37.5 MG TABLET: 37.5 | 30 days supply | Qty: 30 | Fill #0

## 2019-04-06 MED FILL — LARISSIA 0.1-20 MG-MCG TABS: 0.1-20 | 56 days supply | Qty: 56 | Fill #0

## 2019-04-22 DIAGNOSIS — F419 Anxiety disorder, unspecified: Secondary | ICD-10-CM | POA: Diagnosis not present

## 2019-04-22 DIAGNOSIS — E119 Type 2 diabetes mellitus without complications: Secondary | ICD-10-CM | POA: Diagnosis not present

## 2019-04-22 DIAGNOSIS — Z6829 Body mass index (BMI) 29.0-29.9, adult: Secondary | ICD-10-CM | POA: Diagnosis not present

## 2019-04-22 DIAGNOSIS — E663 Overweight: Secondary | ICD-10-CM | POA: Diagnosis not present

## 2019-04-22 DIAGNOSIS — F321 Major depressive disorder, single episode, moderate: Secondary | ICD-10-CM | POA: Diagnosis not present

## 2019-04-22 MED FILL — ALPRAZolam 0.25 MG TABS: 0.25 | 30 days supply | Qty: 30 | Fill #0

## 2019-05-08 MED FILL — ESCITALOPRAM 10 MG TABLET: 10 | 90 days supply | Qty: 135 | Fill #0

## 2019-05-25 MED FILL — LARISSIA 0.1-20 MG-MCG TABS: 0.1-20 | 56 days supply | Qty: 56 | Fill #0

## 2019-06-15 MED FILL — PHENTERMINE 37.5 MG TABLET: 37.5 | 30 days supply | Qty: 30 | Fill #0

## 2019-07-15 DIAGNOSIS — F419 Anxiety disorder, unspecified: Secondary | ICD-10-CM | POA: Diagnosis not present

## 2019-07-15 DIAGNOSIS — E079 Disorder of thyroid, unspecified: Secondary | ICD-10-CM | POA: Diagnosis not present

## 2019-07-15 DIAGNOSIS — E782 Mixed hyperlipidemia: Secondary | ICD-10-CM | POA: Diagnosis not present

## 2019-07-15 DIAGNOSIS — E119 Type 2 diabetes mellitus without complications: Secondary | ICD-10-CM | POA: Diagnosis not present

## 2019-07-15 DIAGNOSIS — E663 Overweight: Secondary | ICD-10-CM | POA: Diagnosis not present

## 2019-07-15 DIAGNOSIS — J302 Other seasonal allergic rhinitis: Secondary | ICD-10-CM | POA: Diagnosis not present

## 2019-07-15 DIAGNOSIS — E669 Obesity, unspecified: Secondary | ICD-10-CM | POA: Diagnosis not present

## 2019-07-15 DIAGNOSIS — F339 Major depressive disorder, recurrent, unspecified: Secondary | ICD-10-CM | POA: Diagnosis not present

## 2019-07-15 DIAGNOSIS — F321 Major depressive disorder, single episode, moderate: Secondary | ICD-10-CM | POA: Diagnosis not present

## 2019-07-22 DIAGNOSIS — Z6824 Body mass index (BMI) 24.0-24.9, adult: Secondary | ICD-10-CM | POA: Diagnosis not present

## 2019-07-22 DIAGNOSIS — E079 Disorder of thyroid, unspecified: Secondary | ICD-10-CM | POA: Diagnosis not present

## 2019-07-22 DIAGNOSIS — F419 Anxiety disorder, unspecified: Secondary | ICD-10-CM | POA: Diagnosis not present

## 2019-07-22 DIAGNOSIS — J302 Other seasonal allergic rhinitis: Secondary | ICD-10-CM | POA: Diagnosis not present

## 2019-07-22 DIAGNOSIS — E782 Mixed hyperlipidemia: Secondary | ICD-10-CM | POA: Diagnosis not present

## 2019-07-22 DIAGNOSIS — E119 Type 2 diabetes mellitus without complications: Secondary | ICD-10-CM | POA: Diagnosis not present

## 2019-07-22 DIAGNOSIS — F321 Major depressive disorder, single episode, moderate: Secondary | ICD-10-CM | POA: Diagnosis not present

## 2019-11-17 MED FILL — CHLORHEXIDINE 0.12% RINSE: 0.12 | 15 days supply | Qty: 473 | Fill #0

## 2019-11-17 MED FILL — HYDROCODON-APAP 5-325: 5-325 | 1 days supply | Qty: 5 | Fill #0

## 2019-11-17 MED FILL — AMOXICILLIN 500 MG CAPSULE: 500 | 7 days supply | Qty: 21 | Fill #0

## 2019-11-17 MED FILL — DEXAMETHASONE 4 MG TABLET: 4 | 3 days supply | Qty: 9 | Fill #0

## 2019-11-17 MED FILL — IBUPROFEN 400 MG TABS: 400 | 5 days supply | Qty: 30 | Fill #0

## 2019-12-15 DIAGNOSIS — Z111 Encounter for screening for respiratory tuberculosis: Secondary | ICD-10-CM | POA: Diagnosis not present

## 2020-04-07 ENCOUNTER — Other Ambulatory Visit: Payer: Self-pay

## 2020-04-07 ENCOUNTER — Encounter: Payer: Self-pay | Admitting: Women's Health

## 2020-04-07 ENCOUNTER — Other Ambulatory Visit (HOSPITAL_COMMUNITY)
Admission: RE | Admit: 2020-04-07 | Discharge: 2020-04-07 | Disposition: A | Discharge status: Home / Self Care | Payer: BC Managed Care – PPO | Source: Ambulatory Visit | Attending: Obstetrics & Gynecology | Admitting: Obstetrics & Gynecology

## 2020-04-07 ENCOUNTER — Ambulatory Visit (INDEPENDENT_AMBULATORY_CARE_PROVIDER_SITE_OTHER): Payer: BC Managed Care – PPO | Admitting: Women's Health

## 2020-04-07 VITALS — BP 131/88 | HR 83 | Ht 62.0 in | Wt 149.0 lb

## 2020-04-07 DIAGNOSIS — L680 Hirsutism: Secondary | ICD-10-CM | POA: Diagnosis not present

## 2020-04-07 DIAGNOSIS — Z01419 Encounter for gynecological examination (general) (routine) without abnormal findings: Secondary | ICD-10-CM | POA: Insufficient documentation

## 2020-04-07 DIAGNOSIS — N911 Secondary amenorrhea: Secondary | ICD-10-CM | POA: Diagnosis not present

## 2020-04-07 DIAGNOSIS — N926 Irregular menstruation, unspecified: Secondary | ICD-10-CM | POA: Diagnosis not present

## 2020-04-07 LAB — POCT URINE PREGNANCY: Preg Test, Ur: NEGATIVE

## 2020-04-07 MED ORDER — MEDROXYPROGESTERONE ACETATE 10 MG PO TABS: 10.0000 mg | ORAL_TABLET | Freq: Every day | ORAL | 0 refills | Status: DC

## 2020-04-07 NOTE — Progress Notes (Signed)
WELL-WOMAN EXAMINATION Patient name: Kristin Blake MRN [id number]  Date of birth: Feb 04, 1991 Chief Complaint:   Gynecologic Exam  History of Present Illness:   Kristin Blake is a 30 y.o. G65P1011 Caucasian female being seen today for a routine well-woman exam.  Current complaints: stopped COCs in June to try to get pregnant. No period since. Periods have always been regular in the past. HPTs have been neg. Has been getting excess hair in abnormal places, some hair loss. No acne-skin was very oily in past, seems to have evened out now. Had gained some weight in the past, worked w/ PCP and lost some. Got pregnant w/ 1st child immediately when started trying.   Had thyroid goiter w/ partial thyroidectomy in past, labs have been normal since.  Sees PCP tomorrow.  Depression screen [facility] 2/9 04/07/2020  Decreased Interest 0  Down, Depressed, Hopeless 0  PHQ - 2 Score 0  Altered sleeping 1  Tired, decreased energy 1  Change in appetite 0  Feeling bad or failure about yourself  0  Trouble concentrating 0  Moving slowly or fidgety/restless 0  Suicidal thoughts 0  PHQ-9 Score 2     PCP: Roe Rutherford      Will get labs w/ her tomorrow- add prolactin Patient's last menstrual period was 08/16/2019 (exact date). The current method of family planning is none.  Last pap 08/14/17. Results were: NILM w/ HRHPV negative. H/O abnormal pap: no Last mammogram: never. Results were: N/A. Family h/o breast cancer: no Last colonoscopy: never. Results were: N/A. Family h/o colorectal cancer: no Review of Systems:   Pertinent items are noted in HPI Denies any headaches, blurred vision, fatigue, shortness of breath, chest pain, abdominal pain, abnormal vaginal discharge/itching/odor/irritation, problems with periods, bowel movements, urination, or intercourse unless otherwise stated above. Pertinent History Reviewed:  Reviewed past medical,surgical, social and family history.  Reviewed problem list,  medications and allergies. Physical Assessment:   Vitals:   04/07/20 0856  BP: 131/88  Pulse: 83  Weight: 149 lb (67.6 kg)  Height: 5\' 2"  (1.575 m)  Body mass index is 27.25 kg/m.        Physical Examination:   General appearance - well appearing, and in no distress  Mental status - alert, oriented to person, place, and time  Psych:  She has a normal mood and affect  Skin - warm and dry, normal color, no suspicious lesions noted  Chest - effort normal, all lung fields clear to auscultation bilaterally  Heart - normal rate and regular rhythm  Neck:  midline trachea, no thyromegaly or nodules  Breasts - breasts appear normal, no suspicious masses, no skin or nipple changes or  axillary nodes  Abdomen - soft, nontender, nondistended, no masses or organomegaly  Pelvic - VULVA: normal appearing vulva with no masses, tenderness or lesions  VAGINA: normal appearing vagina with normal color and discharge, no lesions  CERVIX: normal appearing cervix without discharge or lesions, no CMT  Thin prep pap is done w/ HR HPV cotesting  UTERUS: uterus is felt to be normal size, shape, consistency and nontender   ADNEXA: No adnexal masses or tenderness noted.  Extremities:  No swelling or varicosities noted  Chaperone:    Results for orders placed or performed in visit on 04/07/20 (from the past 24 hour(s))  POCT urine pregnancy   Collection Time: 04/07/20  9:02 AM  Result Value Ref Range   Preg Test, Ur Negative Negative    Assessment &  Plan:  1) Well-Woman Exam  2) Secondary amenorrhea going on w/ hirsutism> likely PCOS, will try provera challenge (let me know if doesn't bleed when stopping). Get regular screening labs tomorrow w/ PCP (including TSH and prolactin). Consider COCs and metformin, then come off of COCs to try for pregnancy-will discuss w/ pt once labs back and finished provera.  Labs/procedures today: pap  Mammogram @32yo  or sooner if problems Colonoscopy  @30yo  or sooner if problems  Orders Placed This Encounter  Procedures  . POCT urine pregnancy    Meds:  Meds ordered this encounter  Medications  . medroxyPROGESTERone (PROVERA) 10 MG tablet    Sig: Take 1 tablet (10 mg total) by mouth daily.    Dispense:  10 tablet    Refill:  0    Order Specific Question:   Supervising Provider    Answer:   [[id number]]    Follow-up: Return for will call pt.  CNM, [facility] 04/07/2020 9:33 AM

## 2020-04-07 NOTE — Patient Instructions (Addendum)
TSH & Prolactin Have Kristin Blake send me lab results please IF you don't bleed after you stop the provera, please let me know  Diet for Polycystic Ovary Syndrome Polycystic ovary syndrome (PCOS) is a common hormonal disorder that affects a woman's reproductive system. It can cause problems with menstrual periods and make it hard to get and stay pregnant. Changing what you eat can help your hormones reach normal levels, improve your health, and help you better manage PCOS. Following a balanced diet can help you lose weight and improve the way that your body uses the hormone insulin to control blood sugar. This may include:  Eating low-fat (lean) proteins, complex carbohydrates, fresh fruits and vegetables, low-fat dairy products, healthy fats, and fiber.  Cutting down on calories.  Exercising regularly. What are tips for following this plan?  Follow a balanced diet for meals and snacks. Eat breakfast, lunch, dinner, and one or two snacks every day.  Include protein in each meal and snack.  Choose whole grains instead of products that are made with refined flour.  Eat a variety of foods.  Exercise regularly as told by your health care provider. Aim to do at least 30 minutes of exercise on most days of the week.  If you are overweight or obese: ? Pay attention to how many calories you eat. Cutting down on calories can help you lose weight. ? Work with your health care provider or a dietitian to figure out how many calories you need each day. What foods should I eat? Fruits Include a variety of colors and types. All fruits are helpful for PCOS. Vegetables Include a variety of colors and types. All vegetables are helpful for PCOS. Grains Whole grains, such as whole wheat. Whole-grain breads, crackers, cereals, and pasta. Unsweetened oatmeal. Bulgur, barley, quinoa, and brown rice. Tortillas made from corn or whole-wheat flour. Meats and other proteins Lean proteins, such as fish, chicken,  beans, eggs, and tofu. Dairy Low-fat dairy products, such as skim milk, cheese sticks, and yogurt. Beverages Low-fat or fat-free drinks, such as water, low-fat milk, sugar-free drinks, and small amounts of 100% fruit juice. Seasonings and condiments Ketchup. Mustard. Barbecue sauce. Relish. Low-fat or fat-free mayonnaise. Fats and oils Olive oil or canola oil. Walnuts and almonds. The items listed above may not be a complete list of recommended foods and beverages. Contact a dietitian for more options.   What foods should I avoid? Foods that are high in calories or fat, especially saturated or trans fats. Fried foods. Sweets. Products that are made from refined white flour, including white bread, pastries, white rice, and pasta. The items listed above may not be a complete list of foods and beverages to avoid. Contact a dietitian for more information. Summary  PCOS is a hormonal imbalance that affects a woman's reproductive system. It can cause problems with menstrual periods and make it hard to get and stay pregnant.  You can help to manage your PCOS by exercising regularly and eating a healthy, varied diet of vegetables, fruit, whole grains, lean protein, and low-fat dairy products.  Changing what you eat can improve the way that your body uses insulin, help your hormones reach normal levels, and help you lose weight. This information is not intended to replace advice given to you by your health care provider. Make sure you discuss any questions you have with your health care provider. Document Revised: 07/30/2019 Document Reviewed: 07/30/2019 Elsevier Patient Education  2021 [employer].

## 2020-04-08 DIAGNOSIS — F411 Generalized anxiety disorder: Secondary | ICD-10-CM | POA: Insufficient documentation

## 2020-04-08 DIAGNOSIS — R946 Abnormal results of thyroid function studies: Secondary | ICD-10-CM | POA: Insufficient documentation

## 2020-04-08 DIAGNOSIS — E282 Polycystic ovarian syndrome: Secondary | ICD-10-CM | POA: Insufficient documentation

## 2020-04-08 DIAGNOSIS — E890 Postprocedural hypothyroidism: Secondary | ICD-10-CM | POA: Insufficient documentation

## 2020-04-11 LAB — CYTOLOGY - PAP
Comment: NEGATIVE
Diagnosis: NEGATIVE
High risk HPV: NEGATIVE

## 2020-06-20 ENCOUNTER — Encounter: Payer: Self-pay | Admitting: Women's Health

## 2020-06-20 ENCOUNTER — Ambulatory Visit (INDEPENDENT_AMBULATORY_CARE_PROVIDER_SITE_OTHER): Payer: BC Managed Care – PPO | Admitting: Women's Health

## 2020-06-20 ENCOUNTER — Other Ambulatory Visit: Payer: Self-pay

## 2020-06-20 VITALS — BP 131/89 | HR 69 | Ht 62.0 in | Wt 153.0 lb

## 2020-06-20 DIAGNOSIS — N970 Female infertility associated with anovulation: Secondary | ICD-10-CM | POA: Diagnosis not present

## 2020-06-20 DIAGNOSIS — N911 Secondary amenorrhea: Secondary | ICD-10-CM | POA: Diagnosis not present

## 2020-06-20 MED ORDER — CLOMIPHENE CITRATE 50 MG PO TABS: 50.0000 mg | ORAL_TABLET | Freq: Every day | ORAL | 2 refills | Status: DC

## 2020-06-20 MED ORDER — MEDROXYPROGESTERONE ACETATE 10 MG PO TABS: 10.0000 mg | ORAL_TABLET | Freq: Every day | ORAL | 2 refills | Status: DC

## 2020-06-20 NOTE — Progress Notes (Signed)
   GYN VISIT Patient name: Kristin Blake MRN [id number]  Date of birth: Aug 12, 1990 Chief Complaint:   Infertility (Wants to discuss meds)  History of Present Illness:   Kristin Blake is a 30 y.o. G12P1011 Caucasian female being seen today to discuss fertility meds.  No period since 2/18 (took provera to start period), last period prior to that was in June when she came off of COCs to try for pregnancy. Periods regular prior to COCs. Taking pnv.  Started metformin from PCP in Feb (A1C was 5.6 and to help w/ ovulation).  Depression screen [facility] 2/9 04/07/2020  Decreased Interest 0  Down, Depressed, Hopeless 0  PHQ - 2 Score 0  Altered sleeping 1  Tired, decreased energy 1  Change in appetite 0  Feeling bad or failure about yourself  0  Trouble concentrating 0  Moving slowly or fidgety/restless 0  Suicidal thoughts 0  PHQ-9 Score 2    Patient's last menstrual period was 04/22/2020 (exact date). The current method of family planning is none.  Last pap 04/07/20. Results were: NILM w/ HRHPV negative Review of Systems:   Pertinent items are noted in HPI Denies fever/chills, dizziness, headaches, visual disturbances, fatigue, shortness of breath, chest pain, abdominal pain, vomiting, abnormal vaginal discharge/itching/odor/irritation, problems with periods, bowel movements, urination, or intercourse unless otherwise stated above.  Pertinent History Reviewed:  Reviewed past medical,surgical, social, obstetrical and family history.  Reviewed problem list, medications and allergies. Physical Assessment:   Vitals:   06/20/20 1515  BP: 131/89  Pulse: 69  Weight: 153 lb (69.4 kg)  Height: 5\' 2"  (1.575 m)  Body mass index is 27.98 kg/m.       Physical Examination:   General appearance: alert, well appearing, and in no distress  Mental status: alert, oriented to person, place, and time  Skin: warm & dry   Cardiovascular: normal heart rate noted  Respiratory: normal respiratory effort, no  distress  Abdomen: soft, non-tender   Pelvic: examination not indicated  Extremities: no edema   Chaperone: N/A    No results found for this or any previous visit (from the past 24 hour(s)).  Assessment & Plan:  1) Anovulation infertility> on metformin from PCP. Last period in Feb (induced by provera challenge). Discussed provera monthly, then clomid on Days 3-7 of cycle, checking progesterone on Day 21 to see if ovulating. Discussed risk fo twins 7-9%, triplets 0.3%. Wants to try provera and clomid. Rx sent. To let me know when bleeding starts after stopping provera. Sex QOD days 7-24. Continue pnv.   Meds:  Meds ordered this encounter  Medications  . medroxyPROGESTERone (PROVERA) 10 MG tablet    Sig: Take 1 tablet (10 mg total) by mouth daily.    Dispense:  10 tablet    Refill:  2    Order Specific Question:   Supervising Provider    Answer:   Mar, LUTHER H [[id number]]  . clomiPHENE (CLOMID) 50 MG tablet    Sig: Take 1 tablet (50 mg total) by mouth daily. Take on days 3-7 of cycle    Dispense:  5 tablet    Refill:  2    Order Specific Question:   Supervising Provider    Answer:   Despina Hidden, LUTHER H [[id number]]    No orders of the defined types were placed in this encounter.   Return for will call pt.  Despina Hidden CNM, [facility] 06/20/2020 3:50 PM

## 2020-06-20 NOTE — Patient Instructions (Signed)
Take provera daily x 10 days, you should start bleeding when you stop taking the medicine Let me know when bleeding starts (Day 1) Take clomid daily on Days 3-7 Have sex every other day on Days 7-24  Pee before sex  Lay w/ hips elevated on pillows x 20-17mins after sex We will get a progesterone level on Day 21 to see if you ovulated

## 2020-06-29 ENCOUNTER — Other Ambulatory Visit: Payer: Self-pay | Admitting: Women's Health

## 2020-06-29 DIAGNOSIS — N979 Female infertility, unspecified: Secondary | ICD-10-CM

## 2020-07-12 ENCOUNTER — Other Ambulatory Visit: Payer: Self-pay | Admitting: Women's Health

## 2020-07-12 DIAGNOSIS — Z789 Other specified health status: Secondary | ICD-10-CM

## 2020-07-20 LAB — BETA HCG QUANT (REF LAB): hCG Quant: <1 m[IU]/mL

## 2020-07-22 LAB — SPECIMEN STATUS REPORT

## 2020-07-26 ENCOUNTER — Other Ambulatory Visit: Payer: Self-pay | Admitting: Women's Health

## 2020-07-26 LAB — PROGESTERONE: Progesterone: 0.2 ng/mL

## 2020-07-26 LAB — SPECIMEN STATUS REPORT

## 2020-07-26 MED ORDER — CLOMIPHENE CITRATE 50 MG PO TABS: 100.0000 mg | ORAL_TABLET | Freq: Every day | ORAL | 2 refills | Status: DC

## 2020-08-09 ENCOUNTER — Other Ambulatory Visit: Payer: Self-pay | Admitting: Women's Health

## 2020-08-09 DIAGNOSIS — Z319 Encounter for procreative management, unspecified: Secondary | ICD-10-CM

## 2020-08-30 LAB — PROGESTERONE: Progesterone: 1.3 ng/mL

## 2021-03-05 NOTE — L&D Delivery Note (Signed)
OB/GYN Faculty Practice Delivery Note  Kristin Blake is a 31 y.o. [id number] s/p VBAC at [redacted]w[redacted]d. She was admitted for SROM.   ROM: 26h 64m with clear fluid GBS Status: Negative   Delivery Date/Time: 09/16/21 at 0456  Delivery: Called to room and patient was complete and pushing. Head delivered occiput anterior. Loose nuchal cord present and reduced at the perineum. Shoulder dystocia encountered and no additional traction was placed on the infant's head. McRobert's maneuver was performed followed by counter clockwise rotation which allowed for release of the anterior shoulder. Shoulders and body then delivered. Infant placed on mother's abdomen, dried and stimulated. Cord clamped x 2 after 1-minute delay and cut. Infant taken to warmer for further resuscitation due to weak initial respiratory effort. Cord blood drawn. Gentle cord traction performed to assist with delivery of placenta. Pitocin infusion initiated. Placenta remained in placed despite gentle cord traction with intermittent fundal massage for 40 minutes. Attending Dr. Despina Hidden called to bedside for further evaluation. Manual extraction subsequently performed. Placenta intact. Uterine sweep smooth without any remaining placental fragments noted. 2 g of Ancef and Methergine x1 given. Fundus firm and bleeding stable. Labia, perineum, vagina, and cervix were inspected, and patient was found to have a small left vaginal side wall laceration that was superficial, hemostatic, and not repaired .   Placenta: Retained - s/p manual extraction. Sent to pathology.  Complications: Shoulder dystocia (~1 minute in duration); retained placenta  Lacerations: Left vaginal side wall  EBL: 300 cc Analgesia: Epidural   Infant: Viable female  APGARs 5 and 8  Weight pending   Evalina Field, MD OB/GYN Fellow, Faculty Practice

## 2021-03-15 ENCOUNTER — Other Ambulatory Visit: Payer: Self-pay | Admitting: Obstetrics & Gynecology

## 2021-03-15 DIAGNOSIS — Z3682 Encounter for antenatal screening for nuchal translucency: Secondary | ICD-10-CM

## 2021-03-16 ENCOUNTER — Ambulatory Visit: Payer: BC Managed Care – PPO | Admitting: *Deleted

## 2021-03-16 ENCOUNTER — Other Ambulatory Visit: Payer: Self-pay

## 2021-03-16 ENCOUNTER — Encounter: Payer: Self-pay | Admitting: Advanced Practice Midwife

## 2021-03-16 ENCOUNTER — Ambulatory Visit (INDEPENDENT_AMBULATORY_CARE_PROVIDER_SITE_OTHER): Payer: BC Managed Care – PPO | Admitting: Advanced Practice Midwife

## 2021-03-16 ENCOUNTER — Ambulatory Visit (INDEPENDENT_AMBULATORY_CARE_PROVIDER_SITE_OTHER): Payer: BC Managed Care – PPO

## 2021-03-16 VITALS — BP 127/86 | HR 92 | Wt 164.0 lb

## 2021-03-16 DIAGNOSIS — Z8632 Personal history of gestational diabetes: Secondary | ICD-10-CM

## 2021-03-16 DIAGNOSIS — Z3480 Encounter for supervision of other normal pregnancy, unspecified trimester: Secondary | ICD-10-CM

## 2021-03-16 DIAGNOSIS — Z3490 Encounter for supervision of normal pregnancy, unspecified, unspecified trimester: Secondary | ICD-10-CM | POA: Insufficient documentation

## 2021-03-16 DIAGNOSIS — O0990 Supervision of high risk pregnancy, unspecified, unspecified trimester: Secondary | ICD-10-CM | POA: Insufficient documentation

## 2021-03-16 DIAGNOSIS — Z98891 History of uterine scar from previous surgery: Secondary | ICD-10-CM | POA: Diagnosis not present

## 2021-03-16 DIAGNOSIS — Z3A12 12 weeks gestation of pregnancy: Secondary | ICD-10-CM | POA: Diagnosis not present

## 2021-03-16 DIAGNOSIS — Z3682 Encounter for antenatal screening for nuchal translucency: Secondary | ICD-10-CM

## 2021-03-16 DIAGNOSIS — O09299 Supervision of pregnancy with other poor reproductive or obstetric history, unspecified trimester: Secondary | ICD-10-CM | POA: Diagnosis not present

## 2021-03-16 DIAGNOSIS — Z3400 Encounter for supervision of normal first pregnancy, unspecified trimester: Secondary | ICD-10-CM

## 2021-03-16 LAB — POCT URINALYSIS DIPSTICK OB
Glucose, UA: NEGATIVE
Ketones, UA: NEGATIVE
Leukocytes, UA: NEGATIVE
Nitrite, UA: NEGATIVE
POC,PROTEIN,UA: NEGATIVE

## 2021-03-16 NOTE — Patient Instructions (Signed)
Kristin Blake, I greatly value your feedback.  If you receive a survey following your visit with us today, we appreciate you taking the time to fill it out.  Thanks, Kristin BeamsFran Cresenzo-Dishmon, DNP, CNM  [facility] is now [facility] ([address] [place], [place] [id number]) Entrance located off of [address] Free 24/7 valet parking   Nausea & Vomiting Have saltine crackers or pretzels by your bed and eat a few bites before you raise your head out of bed in the morning Eat small frequent meals throughout the day instead of large meals Drink plenty of fluids throughout the day to stay hydrated, just don't drink a lot of fluids with your meals.  This can make your stomach fill up faster making you feel sick Do not brush your teeth right after you eat Products with real ginger are good for nausea, like ginger ale and ginger hard candy Make sure it says made with real ginger! Sucking on sour candy like lemon heads is also good for nausea If your prenatal vitamins make you nauseated, take them at night so you will sleep through the nausea Sea Bands If you feel like you need medicine for the nausea & vomiting please let us know If you are unable to keep any fluids or food down please let us know   Constipation Drink plenty of fluid, preferably water, throughout the day Eat foods high in fiber such as fruits, vegetables, and grains Exercise, such as walking, is a good way to keep your bowels regular Drink warm fluids, especially warm prune juice, or decaf coffee Eat a 1/2 cup of real oatmeal (not instant), 1/2 cup applesauce, and 1/2-1 cup warm prune juice every day If needed, you may take Colace (docusate sodium) stool softener once or twice a day to help keep the stool soft.  If you still are having problems with constipation, you may take Miralax once daily as needed to help keep your bowels regular.   Home Blood Pressure Monitoring for  Patients   Your provider has recommended that you check your blood pressure (BP) at least once a week at home. If you do not have a blood pressure cuff at home, one will be provided for you. Contact your provider if you have not received your monitor within 1 week.   Helpful Tips for Accurate Home Blood Pressure Checks  Don't smoke, exercise, or drink caffeine 30 minutes before checking your BP Use the restroom before checking your BP (a full bladder can raise your pressure) Relax in a comfortable upright chair Feet on the ground Left arm resting comfortably on a flat surface at the level of your heart Legs uncrossed Back supported Sit quietly and don't talk Place the cuff on your bare arm Adjust snuggly, so that only two fingertips can fit between your skin and the top of the cuff Check 2 readings separated by at least one minute Keep a log of your BP readings For a visual, please reference this diagram: http://ccnc.care/bpdiagram  Provider Name: [facility]     Phone: [phone]  Zone 1: ALL CLEAR  Continue to monitor your symptoms:  BP reading is less than 140 (top number) or less than 90 (bottom number)  No right upper stomach pain No headaches or seeing spots No feeling nauseated or throwing up No swelling in face and hands  Zone 2: CAUTION Call your doctor's office for any of the following:  BP reading is greater than 140 (top number) or greater than 90 (bottom number)  Stomach pain under your ribs in the middle or right side Headaches or seeing spots Feeling nauseated or throwing up Swelling in face and hands  Zone 3: EMERGENCY  Seek immediate medical care if you have any of the following:  BP reading is greater than160 (top number) or greater than 110 (bottom number) Severe headaches not improving with Tylenol Serious difficulty catching your breath Any worsening symptoms from Zone 2    First Trimester of Pregnancy The first trimester of pregnancy is from  week 1 until the end of week 12 (months 1 through 3). A week after a sperm fertilizes an egg, the egg will implant on the wall of the uterus. This embryo will begin to develop into a baby. Genes from you and your partner are forming the baby. The female genes determine whether the baby is a boy or a girl. At 6-8 weeks, the eyes and face are formed, and the heartbeat can be seen on ultrasound. At the end of 12 weeks, all the baby's organs are formed.  Now that you are pregnant, you will want to do everything you can to have a healthy baby. Two of the most important things are to get good prenatal care and to follow your health care provider's instructions. Prenatal care is all the medical care you receive before the baby's birth. This care will help prevent, find, and treat any problems during the pregnancy and childbirth. BODY CHANGES Your body goes through many changes during pregnancy. The changes vary from woman to woman.  You may gain or lose a couple of pounds at first. You may feel sick to your stomach (nauseous) and throw up (vomit). If the vomiting is uncontrollable, call your health care provider. You may tire easily. You may develop headaches that can be relieved by medicines approved by your health care provider. You may urinate more often. Painful urination may mean you have a bladder infection. You may develop heartburn as a result of your pregnancy. You may develop constipation because certain hormones are causing the muscles that push waste through your intestines to slow down. You may develop hemorrhoids or swollen, bulging veins (varicose veins). Your breasts may begin to grow larger and become tender. Your nipples may stick out more, and the tissue that surrounds them (areola) may become darker. Your gums may bleed and may be sensitive to brushing and flossing. Dark spots or blotches (chloasma, mask of pregnancy) may develop on your face. This will likely fade after the baby is  born. Your menstrual periods will stop. You may have a loss of appetite. You may develop cravings for certain kinds of food. You may have changes in your emotions from day to day, such as being excited to be pregnant or being concerned that something may go wrong with the pregnancy and baby. You may have more vivid and strange dreams. You may have changes in your hair. These can include thickening of your hair, rapid growth, and changes in texture. Some women also have hair loss during or after pregnancy, or hair that feels dry or thin. Your hair will most likely return to normal after your baby is born. WHAT TO EXPECT AT YOUR PRENATAL VISITS During a routine prenatal visit: You will be weighed to make sure you and the baby are growing normally. Your blood pressure will be taken. Your abdomen will be measured to track your baby's growth. The fetal  heartbeat will be listened to starting around week 10 or 12 of your pregnancy. Test results from any previous visits will be discussed. Your health care provider may ask you: How you are feeling. If you are feeling the baby move. If you have had any abnormal symptoms, such as leaking fluid, bleeding, severe headaches, or abdominal cramping. If you have any questions. Other tests that may be performed during your first trimester include: Blood tests to find your blood type and to check for the presence of any previous infections. They will also be used to check for low iron levels (anemia) and Rh antibodies. Later in the pregnancy, blood tests for diabetes will be done along with other tests if problems develop. Urine tests to check for infections, diabetes, or protein in the urine. An ultrasound to confirm the proper growth and development of the baby. An amniocentesis to check for possible genetic problems. Fetal screens for spina bifida and Down syndrome. You may need other tests to make sure you and the baby are doing well. HOME CARE  INSTRUCTIONS  Medicines Follow your health care provider's instructions regarding medicine use. Specific medicines may be either safe or unsafe to take during pregnancy. Take your prenatal vitamins as directed. If you develop constipation, try taking a stool softener if your health care provider approves. Diet Eat regular, well-balanced meals. Choose a variety of foods, such as meat or vegetable-based protein, fish, milk and low-fat dairy products, vegetables, fruits, and whole grain breads and cereals. Your health care provider will help you determine the amount of weight gain that is right for you. Avoid raw meat and uncooked cheese. These carry germs that can cause birth defects in the baby. Eating four or five small meals rather than three large meals a day may help relieve nausea and vomiting. If you start to feel nauseous, eating a few soda crackers can be helpful. Drinking liquids between meals instead of during meals also seems to help nausea and vomiting. If you develop constipation, eat more high-fiber foods, such as fresh vegetables or fruit and whole grains. Drink enough fluids to keep your urine clear or pale yellow. Activity and Exercise Exercise only as directed by your health care provider. Exercising will help you: Control your weight. Stay in shape. Be prepared for labor and delivery. Experiencing pain or cramping in the lower abdomen or low back is a good sign that you should stop exercising. Check with your health care provider before continuing normal exercises. Try to avoid standing for long periods of time. Move your legs often if you must stand in one place for a long time. Avoid heavy lifting. Wear low-heeled shoes, and practice good posture. You may continue to have sex unless your health care provider directs you otherwise. Relief of Pain or Discomfort Wear a good support bra for breast tenderness.   Take warm sitz baths to soothe any pain or discomfort caused by  hemorrhoids. Use hemorrhoid cream if your health care provider approves.   Rest with your legs elevated if you have leg cramps or low back pain. If you develop varicose veins in your legs, wear support hose. Elevate your feet for 15 minutes, 3-4 times a day. Limit salt in your diet. Prenatal Care Schedule your prenatal visits by the twelfth week of pregnancy. They are usually scheduled monthly at first, then more often in the last 2 months before delivery. Write down your questions. Take them to your prenatal visits. Keep all your prenatal visits as directed  by your health care provider. Safety Wear your seat belt at all times when driving. Make a list of emergency phone numbers, including numbers for family, friends, the hospital, and police and fire departments. General Tips Ask your health care provider for a referral to a local prenatal education class. Begin classes no later than at the beginning of month 6 of your pregnancy. Ask for help if you have counseling or nutritional needs during pregnancy. Your health care provider can offer advice or refer you to specialists for help with various needs. Do not use hot tubs, steam rooms, or saunas. Do not douche or use tampons or scented sanitary pads. Do not cross your legs for long periods of time. Avoid cat litter boxes and soil used by cats. These carry germs that can cause birth defects in the baby and possibly loss of the fetus by miscarriage or stillbirth. Avoid all smoking, herbs, alcohol, and medicines not prescribed by your health care provider. Chemicals in these affect the formation and growth of the baby. Schedule a dentist appointment. At home, brush your teeth with a soft toothbrush and be gentle when you floss. SEEK MEDICAL CARE IF:  You have dizziness. You have mild pelvic cramps, pelvic pressure, or nagging pain in the abdominal area. You have persistent nausea, vomiting, or diarrhea. You have a bad smelling vaginal  discharge. You have pain with urination. You notice increased swelling in your face, hands, legs, or ankles. SEEK IMMEDIATE MEDICAL CARE IF:  You have a fever. You are leaking fluid from your vagina. You have spotting or bleeding from your vagina. You have severe abdominal cramping or pain. You have rapid weight gain or loss. You vomit blood or material that looks like coffee grounds. You are exposed to [place] measles and have never had them. You are exposed to fifth disease or chickenpox. You develop a severe headache. You have shortness of breath. You have any kind of trauma, such as from a fall or a car accident. Document Released: 02/13/2001 Document Revised: 07/06/2013 Document Reviewed: 12/30/2012 [facility] Patient Information 2015 [place], [place]. This information is not intended to replace advice given to you by your health care provider. Make sure you discuss any questions you have with your health care provider.  Coronavirus (COVID-19) Are you at risk?  Are you at risk for the Coronavirus (COVID-19)?  To be considered HIGH RISK for Coronavirus (COVID-19), you have to meet the following criteria:  Traveled to [place], [place], [place], [place] or [place];  and have fever, cough, and shortness of breath within the last 2 weeks of travel OR Been in close contact with a person diagnosed with COVID-19 within the last 2 weeks and have fever, cough, and shortness of breath IF YOU DO NOT MEET THESE CRITERIA, YOU ARE CONSIDERED LOW RISK FOR COVID-19.  What to do if you are HIGH RISK for COVID-19?  If you are having a medical emergency, call 911. Seek medical care right away. Before you go to a doctors office, urgent care or emergency department, call ahead and tell them about your recent travel, contact with someone diagnosed with COVID-19, and your symptoms. You should receive instructions from your physicians office regarding next steps of care.  When you arrive at healthcare provider,  tell the healthcare staff immediately you have returned from visiting [place], [place], [place], [place] or [place]; in the last two weeks or you have been in close contact with a person diagnosed with COVID-19 in the last 2 weeks.  Tell the health care staff about your symptoms: fever, cough and shortness of breath. After you have been seen by a medical provider, you will be either: Tested for (COVID-19) and discharged home on quarantine except to seek medical care if symptoms worsen, and asked to  Stay home and avoid contact with others until you get your results (4-5 days)  Avoid travel on public transportation if possible (such as bus, train, or airplane) or Sent to the Emergency Department by EMS for evaluation, COVID-19 testing, and possible admission depending on your condition and test results.  What to do if you are LOW RISK for COVID-19?  Reduce your risk of any infection by using the same precautions used for avoiding the common cold or flu:  Wash your hands often with soap and warm water for at least 20 seconds.  If soap and water are not readily available, use an alcohol-based hand sanitizer with at least 60% alcohol.  If coughing or sneezing, cover your mouth and nose by coughing or sneezing into the elbow areas of your shirt or coat, into a tissue or into your sleeve (not your hands). Avoid shaking hands with others and consider head nods or verbal greetings only. Avoid touching your eyes, nose, or mouth with unwashed hands.  Avoid close contact with people who are sick. Avoid places or events with large numbers of people in one location, like concerts or sporting events. Carefully consider travel plans you have or are making. If you are planning any travel outside or inside the [place], visit the [facility] [employer] [facility] webpage for the latest health notices. If you have some symptoms but not all symptoms, continue to monitor at home and seek medical attention if your symptoms worsen. If  you are having a medical emergency, call 911.   ADDITIONAL HEALTHCARE OPTIONS FOR PATIENTS  [facility] / e-Visit: [email or web address]         [facility]: [phone]  [facility]: [phone]                   [facility]: [phone]     Safe Medications in Pregnancy   Acne: Benzoyl Peroxide Salicylic Acid  Backache/Headache: Tylenol: 2 regular strength every 4 hours OR              2 Extra strength every 6 hours  Colds/Coughs/Allergies: Benadryl (alcohol free) 25 mg every 6 hours as needed Breath right strips Claritin Cepacol throat lozenges Chloraseptic throat spray Cold-Eeze- up to three times per day Cough drops, alcohol free Flonase (by prescription only) Guaifenesin Mucinex Robitussin DM (plain only, alcohol free) Saline nasal spray/drops Sudafed (pseudoephedrine) & Actifed ** use only after [redacted] weeks gestation and if you do not have high blood pressure Tylenol Vicks Vaporub Zinc lozenges Zyrtec   Constipation: Colace Ducolax suppositories Fleet enema Glycerin suppositories Metamucil Milk of magnesia Miralax Senokot Smooth move tea  Diarrhea: Kaopectate Imodium A-D  *NO pepto Bismol  Hemorrhoids: Anusol Anusol HC Preparation H Tucks  Indigestion: Tums Maalox Mylanta Zantac  Pepcid  Insomnia: Benadryl (alcohol free) 25mg  every 6 hours as needed Tylenol PM Unisom, no Gelcaps  Leg Cramps: Tums MagGel  Nausea/Vomiting:  Bonine Dramamine Emetrol Ginger extract Sea bands Meclizine  Nausea medication to take during pregnancy:  Unisom (doxylamine succinate 25 mg tablets) Take one tablet daily at bedtime. If symptoms are not adequately controlled, the dose can be increased to a maximum recommended dose of two tablets daily (1/2 tablet in  the morning, 1/2 tablet mid-afternoon and one at bedtime). Vitamin B6 100mg  tablets. Take one  tablet twice a day (up to 200 mg per day).  Skin Rashes: Aveeno products Benadryl cream or 25mg  every 6 hours as needed Calamine Lotion 1% cortisone cream  Yeast infection: Gyne-lotrimin 7 Monistat 7   **If taking multiple medications, please check labels to avoid duplicating the same active ingredients **take medication as directed on the label ** Do not exceed 4000 mg of tylenol in 24 hours **Do not take medications that contain aspirin or ibuprofen

## 2021-03-16 NOTE — Progress Notes (Signed)
INITIAL OBSTETRICAL VISIT Patient name: Kristin Blake MRN [id number]  Date of birth: [birth year] Chief Complaint:   Initial Prenatal Visit  History of Present Illness:   Kristin Blake is a 31 y.o. G49P1011 Caucasian female at [redacted]w[redacted]d by LMP c/w u/s at 6 weeks with an Estimated Date of Delivery: 09/27/21 being seen today for her initial obstetrical visit.   Her obstetrical history is significant for  CS for NRFHT>  was not in labor, undergoing NSTs for oligo . A2DM.  Today she reports no complaints.  Depression screen [facility] 2/9 03/16/2021 04/07/2020  Decreased Interest 0 0  Down, Depressed, Hopeless 0 0  PHQ - 2 Score 0 0  Altered sleeping 1 1  Tired, decreased energy 1 1  Change in appetite 0 0  Feeling bad or failure about yourself  0 0  Trouble concentrating 0 0  Moving slowly or fidgety/restless 0 0  Suicidal thoughts 0 0  PHQ-9 Score 2 2    Patient's last menstrual period was 04/22/2020 (exact date). Last pap 04/07/20. Results were: normal Review of Systems:   Pertinent items are noted in HPI Denies cramping/contractions, leakage of fluid, vaginal bleeding, abnormal vaginal discharge w/ itching/odor/irritation, headaches, visual changes, shortness of breath, chest pain, abdominal pain, severe nausea/vomiting, or problems with urination or bowel movements unless otherwise stated above.  Pertinent History Reviewed:  Reviewed past medical,surgical, social, obstetrical and family history.  Reviewed problem list, medications and allergies. OB History  Gravida Para Term Preterm AB Living  3 1 1   1 1   SAB IAB Ectopic Multiple Live Births  1       1    # Outcome Date GA Lbr Len/2nd Weight Sex Delivery Anes PTL Lv  3 Current           2 Term 05/10/11 [redacted]w[redacted]d  6 lb 2 oz (2.778 kg) M CS-LTranv Gen N LIV     Complications: Fetal Intolerance, Gestational diabetes, Oligohydramnios  1 SAB 09/2009           Physical Assessment:   Vitals:   03/16/21 1407  BP: 127/86  Pulse: 92   Weight: 164 lb (74.4 kg)  Body mass index is 30 kg/m.       Physical Examination:  General appearance - well appearing, and in no distress  Mental status - alert, oriented to person, place, and time  Psych:  She has a normal mood and affect  Skin - warm and dry, normal color, no suspicious lesions noted  Chest - effort normal  Heart - normal rate and regular rhythm  Abdomen - soft, nontender  Extremities:  No swelling or varicosities  TODAY'S NT 05/14/21 12+1 wks,measurements c/w dates,CRL 62.96 mm,NB present,NT 1.2 mm,normal ovaries,anterior placenta,fhr 162 bpm  Results for orders placed or performed in visit on 03/16/21 (from the past 24 hour(s))  POC Urinalysis Dipstick OB   Collection Time: 03/16/21  3:45 PM  Result Value Ref Range   Color, UA     Clarity, UA     Glucose, UA Negative Negative   Bilirubin, UA     Ketones, UA neg    Spec Grav, UA     Blood, UA trace    pH, UA     POC,PROTEIN,UA Negative Negative, Trace, Small (1+), Moderate (2+), Large (3+), 4+   Urobilinogen, UA     Nitrite, UA neg    Leukocytes, UA Negative Negative   Appearance     Odor  Assessment & Plan:  1) Low-Risk Pregnancy G3P1011 at [redacted]w[redacted]d with an Estimated Date of Delivery: 09/27/21   2) Initial OB visit  3) Prior CS, wants TOLAC  Meds: No orders of the defined types were placed in this encounter.   Initial labs obtained Continue prenatal vitamins Reviewed n/v relief measures and warning s/s to report Reviewed recommended weight gain based on pre-gravid BMI Encouraged well-balanced diet Genetic & carrier screening discussed: requests Panorama, NT/IT, and Horizon , declines AFP Ultrasound discussed; fetal survey: requested [facility] completed> form faxed if has or is planning to apply for medicaid The nature of [facility] [employer] with multiple MDs and other Advanced Practice Providers was explained to patient; also emphasized that fellows, residents, and students are  part of our team. Has home bp cuff. . Check bp weekly, let us know if >140/90.        Kristin Blake 1:15 AM

## 2021-03-16 NOTE — Progress Notes (Signed)
[place] 12+1 wks,measurements c/w dates,CRL 62.96 mm,NB present,NT 1.2 mm,normal ovaries,anterior placenta,fhr 162 bpm

## 2021-03-17 DIAGNOSIS — O09299 Supervision of pregnancy with other poor reproductive or obstetric history, unspecified trimester: Secondary | ICD-10-CM | POA: Insufficient documentation

## 2021-03-17 LAB — GC/CHLAMYDIA PROBE AMP
Chlamydia trachomatis, NAA: NEGATIVE
Neisseria Gonorrhoeae by PCR: NEGATIVE

## 2021-03-18 LAB — CBC/D/PLT+RPR+RH+ABO+RUBIGG...
Antibody Screen: NEGATIVE
Basophils Absolute: 0 10*3/uL (ref 0.0–0.2)
Basos: 0 %
EOS (ABSOLUTE): 0.2 10*3/uL (ref 0.0–0.4)
Eos: 1 %
HCV Ab: <0.1 s/co ratio (ref 0.0–0.9)
HIV Screen 4th Generation wRfx: NONREACTIVE
Hematocrit: 37.3 % (ref 34.0–46.6)
Hemoglobin: 12.7 g/dL (ref 11.1–15.9)
Hepatitis B Surface Ag: NEGATIVE
Immature Grans (Abs): 0 10*3/uL (ref 0.0–0.1)
Immature Granulocytes: 0 %
Lymphocytes Absolute: 2.8 10*3/uL (ref 0.7–3.1)
Lymphs: 25 %
MCH: 31.5 pg (ref 26.6–33.0)
MCHC: 34 g/dL (ref 31.5–35.7)
MCV: 93 fL (ref 79–97)
Monocytes Absolute: 0.8 10*3/uL (ref 0.1–0.9)
Monocytes: 7 %
Neutrophils Absolute: 7.2 10*3/uL — ABNORMAL HIGH (ref 1.4–7.0)
Neutrophils: 67 %
Platelets: 289 10*3/uL (ref 150–450)
RBC: 4.03 x10E6/uL (ref 3.77–5.28)
RDW: 12.4 % (ref 11.7–15.4)
RPR Ser Ql: NONREACTIVE
Rh Factor: POSITIVE
Rubella Antibodies, IGG: 1.07 index (ref >0.99)
WBC: 10.9 10*3/uL — ABNORMAL HIGH (ref 3.4–10.8)

## 2021-03-18 LAB — INTEGRATED 1
Crown Rump Length: 63 mm
Gest. Age on Collection Date: 12.6 weeks
Maternal Age at EDD: 33.1 yr
Nuchal Translucency (NT): 1.2 mm
Number of Fetuses: 1
PAPP-A Value: 530.9 ng/mL
Weight: 164 [lb_av]

## 2021-03-18 LAB — URINE CULTURE

## 2021-03-18 LAB — HCV INTERPRETATION

## 2021-03-18 LAB — HEMOGLOBIN A1C
Est. average glucose Bld gHb Est-mCnc: 117 mg/dL
Hgb A1c MFr Bld: 5.7 % — ABNORMAL HIGH (ref 4.8–5.6)

## 2021-03-29 ENCOUNTER — Encounter: Payer: Self-pay | Admitting: Advanced Practice Midwife

## 2021-04-14 ENCOUNTER — Other Ambulatory Visit: Payer: Self-pay

## 2021-04-14 ENCOUNTER — Encounter: Payer: Self-pay | Admitting: Obstetrics & Gynecology

## 2021-04-14 ENCOUNTER — Ambulatory Visit (INDEPENDENT_AMBULATORY_CARE_PROVIDER_SITE_OTHER): Payer: BC Managed Care – PPO | Admitting: Obstetrics & Gynecology

## 2021-04-14 VITALS — BP 113/77 | HR 95 | Wt 161.2 lb

## 2021-04-14 DIAGNOSIS — Z3482 Encounter for supervision of other normal pregnancy, second trimester: Secondary | ICD-10-CM

## 2021-04-14 DIAGNOSIS — F419 Anxiety disorder, unspecified: Secondary | ICD-10-CM

## 2021-04-14 DIAGNOSIS — Z1379 Encounter for other screening for genetic and chromosomal anomalies: Secondary | ICD-10-CM

## 2021-04-14 MED ORDER — SERTRALINE HCL 100 MG PO TABS: 100.0000 mg | ORAL_TABLET | Freq: Every day | ORAL | 4 refills | Status: DC

## 2021-04-14 MED ORDER — HYDROXYZINE HCL 25 MG PO TABS: 25.0000 mg | ORAL_TABLET | Freq: Three times a day (TID) | ORAL | 6 refills | Status: DC | PRN

## 2021-04-14 NOTE — Progress Notes (Signed)
LOW-RISK PREGNANCY VISIT Patient name: Kristin Blake MRN [id number]  Date of birth: 08/05/1990 Chief Complaint:   Routine Prenatal Visit  History of Present Illness:   Kristin Blake is a 31 y.o. G46P1011 female at [redacted]w[redacted]d with an Estimated Date of Delivery: 09/27/21 being seen today for ongoing management of a low-risk pregnancy.   Depression screen [facility] 2/9 03/16/2021 04/07/2020  Decreased Interest 0 0  Down, Depressed, Hopeless 0 0  PHQ - 2 Score 0 0  Altered sleeping 1 1  Tired, decreased energy 1 1  Change in appetite 0 0  Feeling bad or failure about yourself  0 0  Trouble concentrating 0 0  Moving slowly or fidgety/restless 0 0  Suicidal thoughts 0 0  PHQ-9 Score 2 2    Today she reports feels like the zoloft is not working very well. Still struggling with anxiety/panic attacks and she is aware that she cannot take her xanax. Contractions: Not present. Vag. Bleeding: None.  Movement: Absent. denies leaking of fluid. Review of Systems:   Pertinent items are noted in HPI Denies abnormal vaginal discharge w/ itching/odor/irritation, headaches, visual changes, shortness of breath, chest pain, abdominal pain, severe nausea/vomiting, or problems with urination or bowel movements unless otherwise stated above. Pertinent History Reviewed:  Reviewed past medical,surgical, social, obstetrical and family history.  Reviewed problem list, medications and allergies.  Physical Assessment:   Vitals:   04/14/21 1122  BP: 113/77  Pulse: 95  Weight: 161 lb 3.2 oz (73.1 kg)  Body mass index is 29.48 kg/m.        Physical Examination:   General appearance: Well appearing, and in no distress  Mental status: Alert, oriented to person, place, and time  Skin: Warm & dry  Respiratory: Normal respiratory effort, no distress  Abdomen: Soft, gravid, nontender  Pelvic: Cervical exam deferred         Extremities: Edema: None  Psych:  mood and affect appropriate  Fetal Status: Fetal Heart  Rate (bpm): 155   Movement: Absent    Chaperone: n/a    No results found for this or any previous visit (from the past 24 hour(s)).   Assessment & Plan:  1) Low-risk pregnancy G3P1011 at [redacted]w[redacted]d with an Estimated Date of Delivery: 09/27/21   2) Anxiety- increase to zoloft 100mg  daily increased atarax 25mg  prn- would encouraged to mostly take this at night  3) Prior C-section- desires TOLAC  -anatomy scan next visit   Meds:  Meds ordered this encounter  Medications   sertraline (ZOLOFT) 100 MG tablet    Sig: Take 1 tablet (100 mg total) by mouth daily.    Dispense:  90 tablet    Refill:  4   hydrOXYzine (ATARAX) 25 MG tablet    Sig: Take 1 tablet (25 mg total) by mouth every 8 (eight) hours as needed for anxiety.    Dispense:  30 tablet    Refill:  6   Labs/procedures today: IT-2  Plan:  Continue routine obstetrical care  Next visit: prefers in person    Reviewed: Preterm labor symptoms and general obstetric precautions including but not limited to vaginal bleeding, contractions, leaking of fluid and fetal movement were reviewed in detail with the patient.  All questions were answered. Pt has home bp cuff. Check bp weekly, let know if >140/90.   Follow-up: Return in about 4 weeks (around 05/12/2021), or please print AVS, for LROB visit and anatomy scan.  Orders Placed This Encounter  Procedures  INTEGRATED 2    Myna Hidalgo, DO Attending Obstetrician & Gynecologist, [facility] for [employer], [facility]

## 2021-04-14 NOTE — Patient Instructions (Signed)
For nausea/vomiting you can try  Vitamin B6 25mg   1/2 tab 3x per day as needed [employer] at night

## 2021-04-18 LAB — INTEGRATED 2
AFP MoM: 1.28
Alpha-Fetoprotein: 44.6 ng/mL
Crown Rump Length: 63 mm
DIA MoM: 1.1
DIA Value: 164.2 pg/mL
Estriol, Unconjugated: 1.57 ng/mL
Gest. Age on Collection Date: 12.6 weeks
Gestational Age: 16.7 weeks
Maternal Age at EDD: 33.1 yr
Nuchal Translucency (NT): 1.2 mm
Nuchal Translucency MoM: 0.85
Number of Fetuses: 1
PAPP-A MoM: 0.6
PAPP-A Value: 530.9 ng/mL
Test Results:: NEGATIVE
Weight: 160 [lb_av]
Weight: 164 [lb_av]
hCG MoM: 0.95
hCG Value: 30.6 IU/mL
uE3 MoM: 1.44

## 2021-05-11 ENCOUNTER — Other Ambulatory Visit: Payer: Self-pay | Admitting: Obstetrics & Gynecology

## 2021-05-11 DIAGNOSIS — Z363 Encounter for antenatal screening for malformations: Secondary | ICD-10-CM

## 2021-05-12 ENCOUNTER — Ambulatory Visit (INDEPENDENT_AMBULATORY_CARE_PROVIDER_SITE_OTHER): Payer: BC Managed Care – PPO

## 2021-05-12 ENCOUNTER — Other Ambulatory Visit: Payer: Self-pay

## 2021-05-12 ENCOUNTER — Encounter: Payer: Self-pay | Admitting: Obstetrics & Gynecology

## 2021-05-12 ENCOUNTER — Ambulatory Visit (INDEPENDENT_AMBULATORY_CARE_PROVIDER_SITE_OTHER): Payer: BC Managed Care – PPO | Admitting: Obstetrics & Gynecology

## 2021-05-12 VITALS — BP 116/81 | HR 82 | Wt 163.0 lb

## 2021-05-12 DIAGNOSIS — Z363 Encounter for antenatal screening for malformations: Secondary | ICD-10-CM | POA: Diagnosis not present

## 2021-05-12 DIAGNOSIS — Z3480 Encounter for supervision of other normal pregnancy, unspecified trimester: Secondary | ICD-10-CM

## 2021-05-12 DIAGNOSIS — Z3A20 20 weeks gestation of pregnancy: Secondary | ICD-10-CM | POA: Diagnosis not present

## 2021-05-12 NOTE — Progress Notes (Signed)
[place] 20+2 wks,breech,cx 4 cm.,anterior placenta gr 0,normal ovaries,FHR 154 bpm,SVP of fluid 5 cm,EFW 317 g 23%,anatomy complete,no obvious abnormalities  ?

## 2021-05-12 NOTE — Progress Notes (Signed)
? ?  LOW-RISK PREGNANCY VISIT ?Patient name: Kristin Blake MRN [id number]  Date of birth: [birth year] ?Chief Complaint:   ?Routine Prenatal Visit ([place] today; having trouble sleeping) ? ?History of Present Illness:   ?Kristin Blake is a 31 y.o. G70P1011 female at [redacted]w[redacted]d with an Estimated Date of Delivery: 09/27/21 being seen today for ongoing management of a low-risk pregnancy.  ?Depression screen [facility] 2/9 03/16/2021 04/07/2020  ?Decreased Interest 0 0  ?Down, Depressed, Hopeless 0 0  ?PHQ - 2 Score 0 0  ?Altered sleeping 1 1  ?Tired, decreased energy 1 1  ?Change in appetite 0 0  ?Feeling bad or failure about yourself  0 0  ?Trouble concentrating 0 0  ?Moving slowly or fidgety/restless 0 0  ?Suicidal thoughts 0 0  ?PHQ-9 Score 2 2  ? ? ?Today she reports insomnia. Contractions: Not present. Vag. Bleeding: None.  Movement: Present. denies leaking of fluid. ?Review of Systems:   ?Pertinent items are noted in HPI ?Denies abnormal vaginal discharge w/ itching/odor/irritation, headaches, visual changes, shortness of breath, chest pain, abdominal pain, severe nausea/vomiting, or problems with urination or bowel movements unless otherwise stated above. ?Pertinent History Reviewed:  ?Reviewed past medical,surgical, social, obstetrical and family history.  ?Reviewed problem list, medications and allergies. ?Physical Assessment:  ? ?Vitals:  ? 05/12/21 1141  ?BP: 116/81  ?Pulse: 82  ?Weight: 163 lb (73.9 kg)  ?Body mass index is 29.81 kg/m?. ?  ?     Physical Examination:  ? General appearance: Well appearing, and in no distress ? Mental status: Alert, oriented to person, place, and time ? Skin: Warm & dry ? Cardiovascular: Normal heart rate noted ? Respiratory: Normal respiratory effort, no distress ? Abdomen: Soft, gravid, nontender ? Pelvic: Cervical exam deferred        ? Extremities: Edema: None ? ?Fetal Status:     Movement: Present   ? ?Chaperone: n/a   ? ?No results found for this or any previous visit (from the past 24  hour(s)).  ?Assessment & Plan:  ?1) Low-risk pregnancy G3P1011 at [redacted]w[redacted]d with an Estimated Date of Delivery: 09/27/21  ? ?2) Hx of C section, wants TOLAC ?  ?Meds: No orders of the defined types were placed in this encounter. ? ?Labs/procedures today: sonogram ? ?Plan:  Continue routine obstetrical care  ?Next visit: prefers in person   ? ?Reviewed: Preterm labor symptoms and general obstetric precautions including but not limited to vaginal bleeding, contractions, leaking of fluid and fetal movement were reviewed in detail with the patient.  All questions were answered. Has home bp cuff. . Check bp weekly, let us know if >140/90.  ? ?Follow-up: Return in about 4 weeks (around 06/09/2021) for LROB. ? ?No orders of the defined types were placed in this encounter. ? ? ?Lazaro Arms, MD ?05/12/2021 ?12:05 PM ? ?

## 2021-06-08 ENCOUNTER — Ambulatory Visit (INDEPENDENT_AMBULATORY_CARE_PROVIDER_SITE_OTHER): Payer: BC Managed Care – PPO | Admitting: Advanced Practice Midwife

## 2021-06-08 VITALS — BP 111/75 | HR 83 | Wt 167.0 lb

## 2021-06-08 DIAGNOSIS — Z3A24 24 weeks gestation of pregnancy: Secondary | ICD-10-CM

## 2021-06-08 DIAGNOSIS — Z3482 Encounter for supervision of other normal pregnancy, second trimester: Secondary | ICD-10-CM

## 2021-06-08 NOTE — Progress Notes (Signed)
? ?  LOW-RISK PREGNANCY VISIT ?Patient name: Kristin Blake MRN [id number]  Date of birth: 04/05/1990 ?Chief Complaint:   ?Routine Prenatal Visit ? ?History of Present Illness:   ?Kristin Blake is a 31 y.o. G76P1011 female at [redacted]w[redacted]d with an Estimated Date of Delivery: 09/27/21 being seen today for ongoing management of a low-risk pregnancy.  ?Today she reports no complaints. Contractions: Not present. Vag. Bleeding: None.  Movement: Present. denies leaking of fluid. ?Review of Systems:   ?Pertinent items are noted in HPI ?Denies abnormal vaginal discharge w/ itching/odor/irritation, headaches, visual changes, shortness of breath, chest pain, abdominal pain, severe nausea/vomiting, or problems with urination or bowel movements unless otherwise stated above. ?Pertinent History Reviewed:  ?Reviewed past medical,surgical, social, obstetrical and family history.  ?Reviewed problem list, medications and allergies. ?Physical Assessment:  ? ?Vitals:  ? 06/08/21 1614  ?BP: 111/75  ?Pulse: 83  ?Weight: 167 lb (75.8 kg)  ?Body mass index is 30.54 kg/m?. ?  ?     Physical Examination:  ? General appearance: Well appearing, and in no distress ? Mental status: Alert, oriented to person, place, and time ? Skin: Warm & dry ? Cardiovascular: Normal heart rate noted ? Respiratory: Normal respiratory effort, no distress ? Abdomen: Soft, gravid, nontender ? Pelvic: Cervical exam deferred        ? Extremities: Edema: None ? ?Fetal Status: Fetal Heart Rate (bpm): 148 Fundal Height: 24 cm Movement: Present   ? ?Chaperone: n/a   ? ?No results found for this or any previous visit (from the past 24 hour(s)).  ?Assessment & Plan:  ?1) Low-risk pregnancy G3P1011 at [redacted]w[redacted]d with an Estimated Date of Delivery: 09/27/21  ? ? ?  ?Meds: No orders of the defined types were placed in this encounter. ? ?Labs/procedures today: none ? ?Plan:  Continue routine obstetrical care  ?Next visit: prefers in person   ? ?Reviewed: Preterm labor symptoms and general  obstetric precautions including but not limited to vaginal bleeding, contractions, leaking of fluid and fetal movement were reviewed in detail with the patient.  All questions were answered. Has home bp cuff.. Check bp weekly, let us know if >140/90.  ? ?Follow-up: Return in about 4 weeks (around 07/06/2021) for PN2/LROB. ? ?No orders of the defined types were placed in this encounter. ? ?Christin Fudge DNP, CNM ?06/08/2021 ?4:29 PM ? ?

## 2021-06-08 NOTE — Patient Instructions (Signed)

## 2021-07-07 ENCOUNTER — Other Ambulatory Visit: Payer: BC Managed Care – PPO

## 2021-07-07 ENCOUNTER — Ambulatory Visit (INDEPENDENT_AMBULATORY_CARE_PROVIDER_SITE_OTHER): Payer: BC Managed Care – PPO | Admitting: Obstetrics & Gynecology

## 2021-07-07 VITALS — BP 129/88 | HR 82 | Wt 170.0 lb

## 2021-07-07 DIAGNOSIS — Z8632 Personal history of gestational diabetes: Secondary | ICD-10-CM

## 2021-07-07 DIAGNOSIS — Z3480 Encounter for supervision of other normal pregnancy, unspecified trimester: Secondary | ICD-10-CM

## 2021-07-07 DIAGNOSIS — R030 Elevated blood-pressure reading, without diagnosis of hypertension: Secondary | ICD-10-CM

## 2021-07-07 DIAGNOSIS — O26899 Other specified pregnancy related conditions, unspecified trimester: Secondary | ICD-10-CM

## 2021-07-07 DIAGNOSIS — Z131 Encounter for screening for diabetes mellitus: Secondary | ICD-10-CM

## 2021-07-07 DIAGNOSIS — R102 Pelvic and perineal pain: Secondary | ICD-10-CM

## 2021-07-07 DIAGNOSIS — Z3483 Encounter for supervision of other normal pregnancy, third trimester: Secondary | ICD-10-CM

## 2021-07-07 DIAGNOSIS — O09299 Supervision of pregnancy with other poor reproductive or obstetric history, unspecified trimester: Secondary | ICD-10-CM

## 2021-07-07 DIAGNOSIS — Z3A28 28 weeks gestation of pregnancy: Secondary | ICD-10-CM

## 2021-07-07 DIAGNOSIS — Z98891 History of uterine scar from previous surgery: Secondary | ICD-10-CM

## 2021-07-07 NOTE — Progress Notes (Signed)
? ?LOW-RISK PREGNANCY VISIT ?Patient name: Kristin Blake MRN [id number]  Date of birth: 1990-04-22 ?Chief Complaint:   ?Routine Prenatal Visit ? ?History of Present Illness:   ?Kristin Blake is a 31 y.o. G37P1011 female at [redacted]w[redacted]d with an Estimated Date of Delivery: 09/27/21 being seen today for ongoing management of a low-risk pregnancy.  ? ?  03/16/2021  ?  2:08 PM 04/07/2020  ?  8:54 AM  ?Depression screen PHQ 2/9  ?Decreased Interest 0 0  ?Down, Depressed, Hopeless 0 0  ?PHQ - 2 Score 0 0  ?Altered sleeping 1 1  ?Tired, decreased energy 1 1  ?Change in appetite 0 0  ?Feeling bad or failure about yourself  0 0  ?Trouble concentrating 0 0  ?Moving slowly or fidgety/restless 0 0  ?Suicidal thoughts 0 0  ?PHQ-9 Score 2 2  ? ? ?Today she reports pelvic pressure/pain. Contractions: Not present. Vag. Bleeding: None.  Movement: Present. denies leaking of fluid. ?Review of Systems:   ?Pertinent items are noted in HPI ?Denies abnormal vaginal discharge w/ itching/odor/irritation, headaches, visual changes, shortness of breath, chest pain, abdominal pain, severe nausea/vomiting, or problems with urination or bowel movements unless otherwise stated above. ?Pertinent History Reviewed:  ?Reviewed past medical,surgical, social, obstetrical and family history.  ?Reviewed problem list, medications and allergies. ?Physical Assessment:  ? ?Vitals:  ? 07/07/21 0846  ?BP: 129/88  ?Pulse: 82  ?Weight: 170 lb (77.1 kg)  ?Body mass index is 31.09 kg/m?. ?  ?     Physical Examination:  ? General appearance: Well appearing, and in no distress ? Mental status: Alert, oriented to person, place, and time ? Skin: Warm & dry ? Cardiovascular: Normal heart rate noted ? Respiratory: Normal respiratory effort, no distress ? Abdomen: Soft, gravid, nontender ? Pelvic: Cervical exam deferred        ? Extremities: Edema: None ? ?Fetal Status:     Movement: Present   ? ?Chaperone: n/a   ? ?No results found for this or any previous visit (from the past  24 hour(s)).  ?Assessment & Plan:  ?1) Low-risk pregnancy G3P1011 at [redacted]w[redacted]d with an Estimated Date of Delivery: 09/27/21  ? ?  ICD-10-CM   ?1. Supervision of other normal pregnancy, antepartum  Z34.80   ?  ?2. History of cesarean delivery  Z98.891   ? deisres TOLAC, c section due to NR FHRT  ?  ?3. Borderline hypertension  R03.0   ? 12 week BP 127/86, so now approaching gestation age at which we would expect to appraoach her baseline BP as progesterone effects dissipate  ?  ?4. History of gestational diabetes in prior pregnancy, currently pregnant  O09.299   ? Z86.32   ? 2 hour GTT today  ?  ?5. Pregnancy related pelvic pain, antepartum  O26.899   ? R10.2   ? Recommended Prenatl Cradle and discussed pelvic floor symptoms with previous pregnancy as well as raising a child  ?  ? ? ?  ?Meds: No orders of the defined types were placed in this encounter. ? ?Labs/procedures today: PN2 ? ?Plan:  Continue routine obstetrical care  ?Next visit: prefers in person   ? ?Reviewed: Preterm labor symptoms and general obstetric precautions including but not limited to vaginal bleeding, contractions, leaking of fluid and fetal movement were reviewed in detail with the patient.  All questions were answered. Has home bp cuff. Rx faxed to . Check bp weekly, let us know if >140/90.  ? ?Follow-up: Return in about  3 weeks (around 07/28/2021) for LROB. ? ?No orders of the defined types were placed in this encounter. ? ? ?Florian Buff, MD ?07/07/2021 ?9:22 AM ? ?

## 2021-07-08 LAB — CBC
Hematocrit: 36 % (ref 34.0–46.6)
Hemoglobin: 11.9 g/dL (ref 11.1–15.9)
MCH: 30.5 pg (ref 26.6–33.0)
MCHC: 33.1 g/dL (ref 31.5–35.7)
MCV: 92 fL (ref 79–97)
Platelets: 230 10*3/uL (ref 150–450)
RBC: 3.9 x10E6/uL (ref 3.77–5.28)
RDW: 13 % (ref 11.7–15.4)
WBC: 10.4 10*3/uL (ref 3.4–10.8)

## 2021-07-08 LAB — GLUCOSE TOLERANCE, 2 HOURS W/ 1HR
Glucose, 1 hour: 228 mg/dL — ABNORMAL HIGH (ref 70–179)
Glucose, 2 hour: 184 mg/dL — ABNORMAL HIGH (ref 70–152)
Glucose, Fasting: 104 mg/dL — ABNORMAL HIGH (ref 70–91)

## 2021-07-08 LAB — RPR: RPR Ser Ql: NONREACTIVE

## 2021-07-08 LAB — ANTIBODY SCREEN: Antibody Screen: NEGATIVE

## 2021-07-08 LAB — HIV ANTIBODY (ROUTINE TESTING W REFLEX): HIV Screen 4th Generation wRfx: NONREACTIVE

## 2021-07-10 ENCOUNTER — Telehealth: Payer: Self-pay | Admitting: *Deleted

## 2021-07-10 ENCOUNTER — Encounter: Payer: Self-pay | Admitting: Women's Health

## 2021-07-10 ENCOUNTER — Other Ambulatory Visit: Payer: Self-pay | Admitting: Women's Health

## 2021-07-10 DIAGNOSIS — O24410 Gestational diabetes mellitus in pregnancy, diet controlled: Secondary | ICD-10-CM

## 2021-07-10 DIAGNOSIS — Z8632 Personal history of gestational diabetes: Secondary | ICD-10-CM | POA: Insufficient documentation

## 2021-07-10 DIAGNOSIS — O24419 Gestational diabetes mellitus in pregnancy, unspecified control: Secondary | ICD-10-CM | POA: Insufficient documentation

## 2021-07-10 MED ORDER — ASPIRIN 81 MG PO TBEC: 81.0000 mg | DELAYED_RELEASE_TABLET | Freq: Every day | ORAL | 3 refills | Status: DC

## 2021-07-10 MED ORDER — ACCU-CHEK GUIDE VI STRP: ORAL_STRIP | 12 refills | Status: DC

## 2021-07-10 MED ORDER — ACCU-CHEK GUIDE ME W/DEVICE KIT: PACK | 0 refills | Status: DC

## 2021-07-10 MED ORDER — ACCU-CHEK SOFTCLIX LANCETS MISC: 12 refills | Status: DC

## 2021-07-10 NOTE — Telephone Encounter (Signed)
-----   Message from Cheral Marker, [place] sent at 07/10/2021  1:45 PM EDT ----- ?She has GDM.  I have sent her a MyChart message letting her know. Please check to make sure she has read this, if she hasn't please call and let her know. Please call in a glucometer/strips/lancets QS for QID testing w/ prn refills, whatever her insurance will cover, route message to Bev @ [facility] for dietician appointment. Thanks!  ?

## 2021-07-20 ENCOUNTER — Encounter: Payer: PRIVATE HEALTH INSURANCE | Attending: Women's Health | Admitting: Registered"

## 2021-07-20 ENCOUNTER — Ambulatory Visit: Payer: BC Managed Care – PPO | Admitting: Registered"

## 2021-07-20 DIAGNOSIS — O24410 Gestational diabetes mellitus in pregnancy, diet controlled: Secondary | ICD-10-CM | POA: Diagnosis present

## 2021-07-20 DIAGNOSIS — O24419 Gestational diabetes mellitus in pregnancy, unspecified control: Secondary | ICD-10-CM

## 2021-07-20 DIAGNOSIS — Z3A00 Weeks of gestation of pregnancy not specified: Secondary | ICD-10-CM | POA: Diagnosis not present

## 2021-07-20 NOTE — Progress Notes (Signed)
(  [facility]) Patient was seen for Gestational Diabetes self-management on 07/20/2021  Start time 1515 and End time 1600   Estimated due date: 09/27/21; [redacted]w[redacted]d  Clinical: Medications: reviewed Medical History: PCOS, GDM Labs: OGTT 104(H)-228(H)-184(H), A1c 5.7%   Dietary and Lifestyle History: Pt states h/o metformin to help with ovulation. Pt reports she had GDM with last pregnancy and had to use low dose glyburide to control, and ending up having o eat more to prevent low blood sugar.  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 Stress: not assessed Sleep: at least 8 hrs, uses benedril to help  24 hr Recall:  First Meal: Protein shake Glucerna OR a little fruit, Greek yogurt, pro powder Snack: sometimes Second meal: freezer meal, chicken, broccoli, mushrooms, potato, sugar-free jello Snack: sometimes protein bars Third meal: (7-8:30) chicken, green beans, tiny portion mashed potatoes (1/2 potato), salad cucumbers Snack: none Beverages: water, or unsweet tea or zero-sugar sprite  NUTRITION INTERVENTION  Nutrition education (E-1) on the following topics:   Initial Follow-up  [x]  []  Definition of Gestational Diabetes [x]  []  Why dietary management is important in controlling blood glucose [x]  []  Effects each nutrient has on blood glucose levels [x]  []  Simple carbohydrates vs complex carbohydrates [x]  []  Fluid intake [x]  []  Creating a balanced meal plan [x]  []  Carbohydrate counting  [x]  []  When to check blood glucose levels [x]  []  Proper blood glucose monitoring techniques [x]  []  Effect of stress and stress reduction techniques  [x]  []  Exercise effect on blood glucose levels, appropriate exercise during pregnancy [x]  []  Importance of limiting caffeine and abstaining from alcohol and smoking [x]  []  Medications used for blood sugar  control during pregnancy [x]  []  Hypoglycemia and rule of 15 [x]  []  Postpartum self care  Patient already has a meter, is testing pre breakfast and 2 hours after each meal. FBS: WNL Postprandial: 80s-160s; more recent values are mostly WNL  Patient instructed to monitor glucose levels: FBS: 60 - ? 95 mg/dL (some clinics use 90 for cutoff) 1 hour: ? 140 mg/dL 2 hour: ? mg/dL  Patient received handouts: Nutrition Diabetes and Pregnancy Carbohydrate Counting List  Patient will be seen for follow-up as needed.

## 2021-07-28 ENCOUNTER — Ambulatory Visit (INDEPENDENT_AMBULATORY_CARE_PROVIDER_SITE_OTHER): Payer: BC Managed Care – PPO | Admitting: Advanced Practice Midwife

## 2021-07-28 VITALS — BP 115/78 | HR 120 | Wt 171.0 lb

## 2021-07-28 DIAGNOSIS — O24410 Gestational diabetes mellitus in pregnancy, diet controlled: Secondary | ICD-10-CM

## 2021-07-28 DIAGNOSIS — O24419 Gestational diabetes mellitus in pregnancy, unspecified control: Secondary | ICD-10-CM

## 2021-07-28 DIAGNOSIS — O0990 Supervision of high risk pregnancy, unspecified, unspecified trimester: Secondary | ICD-10-CM

## 2021-07-28 DIAGNOSIS — Z3A31 31 weeks gestation of pregnancy: Secondary | ICD-10-CM

## 2021-07-28 DIAGNOSIS — Z98891 History of uterine scar from previous surgery: Secondary | ICD-10-CM

## 2021-07-28 MED ORDER — METFORMIN HCL 1000 MG PO TABS: 1000.0000 mg | ORAL_TABLET | Freq: Every day | ORAL | 5 refills | Status: DC

## 2021-07-28 NOTE — Patient Instructions (Signed)
Kristin Blake, thank you for choosing our office today! We appreciate the opportunity to meet your healthcare needs. You may receive a short survey by mail, e-mail, or through MyChart. If you are happy with your care we would appreciate if you could take just a few minutes to complete the survey questions. We read all of your comments and take your feedback very seriously. Thank you again for choosing our office.  [facility] ([address] [place], [place] [address]) Entrance C, located off of [address] Free 24/7 valet parking   CLASSES: Go to [email or web address] to register for classes (childbirth, breastfeeding, waterbirth, infant CPR, daddy bootcamp, etc.)  Call the office ([phone]) or go to [facility] if: You begin to have strong, frequent contractions Your water breaks.  Sometimes it is a big gush of fluid, sometimes it is just a trickle that keeps getting your panties wet or running down your legs You have vaginal bleeding.  It is normal to have a small amount of spotting if your cervix was checked.  You don't feel your baby moving like normal.  If you don't, get you something to eat and drink and lay down and focus on feeling your baby move.   If your baby is still not moving like normal, you should call the office or go to [facility].  Call the office ([phone]) or go to [facility] for these signs of pre-eclampsia: Severe headache that does not go away with Tylenol Visual changes- seeing spots, double, blurred vision Pain under your right breast or upper abdomen that does not go away with Tums or heartburn medicine Nausea and/or vomiting Severe swelling in your hands, feet, and face   Tdap Vaccine It is recommended that you get the Tdap vaccine during the third trimester of EACH pregnancy to help protect your baby from getting pertussis (whooping cough) 27-36 weeks is the BEST time to do  this so that you can pass the protection on to your baby. During pregnancy is better than after pregnancy, but if you are unable to get it during pregnancy it will be offered at the hospital.  You can get this vaccine with us, at the health department, your family doctor, or some local pharmacies Everyone who will be around your baby should also be up-to-date on their vaccines before the baby comes. Adults (who are not pregnant) only need 1 dose of Tdap during adulthood.   [place] Pediatricians/Family Doctors [facility] ([facility]): [address], [phone]           [facility]: [address], [phone]                [facility] ([facility]): [address], [phone] (call to ask if accepting patients) [facility]: [address], [place], [phone]    [place] Pediatricians/Family Doctors [facility] ([facility]): [address], [phone] [facility]: [address], [phone] [facility]: [address], [phone]  [place] [facility]  [facility] ([facility]): [phone] [facility]: [address], [phone]   [place] [facility]: [address], [phone]  [place] [facility]  [facility]: [phone], [phone]  Home Blood Pressure Monitoring for Patients   Your provider has recommended that you check your   blood pressure (BP) at least once a week at home. If you do not have a blood pressure cuff at home, one will be provided for you. Contact your provider if you have not received your monitor within 1 week.   Helpful Tips for Accurate Home Blood Pressure Checks  Don't smoke, exercise, or drink caffeine 30 minutes before checking your BP Use the restroom before checking your BP (a full bladder can raise your  pressure) Relax in a comfortable upright chair Feet on the ground Left arm resting comfortably on a flat surface at the level of your heart Legs uncrossed Back supported Sit quietly and don't talk Place the cuff on your bare arm Adjust snuggly, so that only two fingertips can fit between your skin and the top of the cuff Check 2 readings separated by at least one minute Keep a log of your BP readings For a visual, please reference this diagram: http://ccnc.care/bpdiagram  Provider Name: [facility]     Phone: [phone]  Zone 1: ALL CLEAR  Continue to monitor your symptoms:  BP reading is less than 140 (top number) or less than 90 (bottom number)  No right upper stomach pain No headaches or seeing spots No feeling nauseated or throwing up No swelling in face and hands  Zone 2: CAUTION Call your doctor's office for any of the following:  BP reading is greater than 140 (top number) or greater than 90 (bottom number)  Stomach pain under your ribs in the middle or right side Headaches or seeing spots Feeling nauseated or throwing up Swelling in face and hands  Zone 3: EMERGENCY  Seek immediate medical care if you have any of the following:  BP reading is greater than160 (top number) or greater than 110 (bottom number) Severe headaches not improving with Tylenol Serious difficulty catching your breath Any worsening symptoms from Zone 2   Third Trimester of Pregnancy The third trimester is from week 29 through week 42, months 7 through 9. The third trimester is a time when the fetus is growing rapidly. At the end of the ninth month, the fetus is about 20 inches in length and weighs 6-10 pounds.  BODY CHANGES Your body goes through many changes during pregnancy. The changes vary from woman to woman.  Your weight will continue to increase. You can expect to gain 25-35 pounds (11-16 kg) by the end of the pregnancy. You may begin to get stretch marks on your hips, abdomen,  and breasts. You may urinate more often because the fetus is moving lower into your pelvis and pressing on your bladder. You may develop or continue to have heartburn as a result of your pregnancy. You may develop constipation because certain hormones are causing the muscles that push waste through your intestines to slow down. You may develop hemorrhoids or swollen, bulging veins (varicose veins). You may have pelvic pain because of the weight gain and pregnancy hormones relaxing your joints between the bones in your pelvis. Backaches may result from overexertion of the muscles supporting your posture. You may have changes in your hair. These can include thickening of your hair, rapid growth, and changes in texture. Some women also have hair loss during or after pregnancy, or hair that feels dry or thin. Your hair will most likely return to normal after your baby is born. Your breasts will continue to grow and be tender. A yellow discharge may leak from your breasts called colostrum. Your belly button may stick out. You may   feel short of breath because of your expanding uterus. You may notice the fetus "dropping," or moving lower in your abdomen. You may have a bloody mucus discharge. This usually occurs a few days to a week before labor begins. Your cervix becomes thin and soft (effaced) near your due date. WHAT TO EXPECT AT YOUR PRENATAL EXAMS  You will have prenatal exams every 2 weeks until week 36. Then, you will have weekly prenatal exams. During a routine prenatal visit: You will be weighed to make sure you and the fetus are growing normally. Your blood pressure is taken. Your abdomen will be measured to track your baby's growth. The fetal heartbeat will be listened to. Any test results from the previous visit will be discussed. You may have a cervical check near your due date to see if you have effaced. At around 36 weeks, your caregiver will check your cervix. At the same time, your  caregiver will also perform a test on the secretions of the vaginal tissue. This test is to determine if a type of bacteria, Group B streptococcus, is present. Your caregiver will explain this further. Your caregiver may ask you: What your birth plan is. How you are feeling. If you are feeling the baby move. If you have had any abnormal symptoms, such as leaking fluid, bleeding, severe headaches, or abdominal cramping. If you have any questions. Other tests or screenings that may be performed during your third trimester include: Blood tests that check for low iron levels (anemia). Fetal testing to check the health, activity level, and growth of the fetus. Testing is done if you have certain medical conditions or if there are problems during the pregnancy. FALSE LABOR You may feel small, irregular contractions that eventually go away. These are called Braxton Hicks contractions, or false labor. Contractions may last for hours, days, or even weeks before true labor sets in. If contractions come at regular intervals, intensify, or become painful, it is best to be seen by your caregiver.  SIGNS OF LABOR  Menstrual-like cramps. Contractions that are 5 minutes apart or less. Contractions that start on the top of the uterus and spread down to the lower abdomen and back. A sense of increased pelvic pressure or back pain. A watery or bloody mucus discharge that comes from the vagina. If you have any of these signs before the 37th week of pregnancy, call your caregiver right away. You need to go to the hospital to get checked immediately. HOME CARE INSTRUCTIONS  Avoid all smoking, herbs, alcohol, and unprescribed drugs. These chemicals affect the formation and growth of the baby. Follow your caregiver's instructions regarding medicine use. There are medicines that are either safe or unsafe to take during pregnancy. Exercise only as directed by your caregiver. Experiencing uterine cramps is a good sign to  stop exercising. Continue to eat regular, healthy meals. Wear a good support bra for breast tenderness. Do not use hot tubs, steam rooms, or saunas. Wear your seat belt at all times when driving. Avoid raw meat, uncooked cheese, cat litter boxes, and soil used by cats. These carry germs that can cause birth defects in the baby. Take your prenatal vitamins. Try taking a stool softener (if your caregiver approves) if you develop constipation. Eat more high-fiber foods, such as fresh vegetables or fruit and whole grains. Drink plenty of fluids to keep your urine clear or pale yellow. Take warm sitz baths to soothe any pain or discomfort caused by hemorrhoids. Use hemorrhoid cream if   your caregiver approves. If you develop varicose veins, wear support hose. Elevate your feet for 15 minutes, 3-4 times a day. Limit salt in your diet. Avoid heavy lifting, wear low heal shoes, and practice good posture. Rest a lot with your legs elevated if you have leg cramps or low back pain. Visit your dentist if you have not gone during your pregnancy. Use a soft toothbrush to brush your teeth and be gentle when you floss. A sexual relationship may be continued unless your caregiver directs you otherwise. Do not travel far distances unless it is absolutely necessary and only with the approval of your caregiver. Take prenatal classes to understand, practice, and ask questions about the labor and delivery. Make a trial run to the hospital. Pack your hospital bag. Prepare the baby's nursery. Continue to go to all your prenatal visits as directed by your caregiver. SEEK MEDICAL CARE IF: You are unsure if you are in labor or if your water has broken. You have dizziness. You have mild pelvic cramps, pelvic pressure, or nagging pain in your abdominal area. You have persistent nausea, vomiting, or diarrhea. You have a bad smelling vaginal discharge. You have pain with urination. SEEK IMMEDIATE MEDICAL CARE IF:  You  have a fever. You are leaking fluid from your vagina. You have spotting or bleeding from your vagina. You have severe abdominal cramping or pain. You have rapid weight loss or gain. You have shortness of breath with chest pain. You notice sudden or extreme swelling of your face, hands, ankles, feet, or legs. You have not felt your baby move in over an hour. You have severe headaches that do not go away with medicine. You have vision changes. Document Released: 02/13/2001 Document Revised: 02/24/2013 Document Reviewed: 04/22/2012 [facility] Patient Information 2015 [place], [place]. This information is not intended to replace advice given to you by your health care provider. Make sure you discuss any questions you have with your health care provider.

## 2021-07-28 NOTE — Progress Notes (Signed)
HIGH-RISK PREGNANCY VISIT Patient name: Kristin Blake MRN [id number]  Date of birth: May 07, 1990 Chief Complaint:   Routine Prenatal Visit  History of Present Illness:   Kristin Blake is a 31 y.o. G25P1011 female at [redacted]w[redacted]d with an Estimated Date of Delivery: 09/27/21 being seen today for ongoing management of a high-risk pregnancy complicated by diabetes mellitus A2DM currently on Metformin 1000mg  q AM starting today .  Her log showed all fasting values <95 with varied postprandial results- some in the 150s and 170s and a good majority >120. She is trying to keep a food log as well to determine which items are giving her the higher values. Of note, she took Metformin 1000mg  q AM when trying to conceive and other than some initial GI symptoms, did well.  Today she reports  having some low back pain and intermittent ctx that seem activity dependent . Contractions: Not present. Vag. Bleeding: None.  Movement: Present. denies leaking of fluid.      03/16/2021    2:08 PM 04/07/2020    8:54 AM  Depression screen PHQ 2/9  Decreased Interest 0 0  Down, Depressed, Hopeless 0 0  PHQ - 2 Score 0 0  Altered sleeping 1 1  Tired, decreased energy 1 1  Change in appetite 0 0  Feeling bad or failure about yourself  0 0  Trouble concentrating 0 0  Moving slowly or fidgety/restless 0 0  Suicidal thoughts 0 0  PHQ-9 Score 2 2        03/16/2021    2:08 PM 04/07/2020    8:56 AM  GAD 7 : Generalized Anxiety Score  Nervous, Anxious, on Edge 1 1  Control/stop worrying 0 0  Worry too much - different things 1 1  Trouble relaxing 0 0  Restless 0 0  Easily annoyed or irritable 0 0  Afraid - awful might happen 0 0  Total GAD 7 Score 2 2     Review of Systems:   Pertinent items are noted in HPI Denies abnormal vaginal discharge w/ itching/odor/irritation, headaches, visual changes, shortness of breath, chest pain, abdominal pain, severe nausea/vomiting, or problems with urination or bowel movements  unless otherwise stated above. Pertinent History Reviewed:  Reviewed past medical,surgical, social, obstetrical and family history.  Reviewed problem list, medications and allergies. Physical Assessment:   Vitals:   07/28/21 0907  BP: 115/78  Pulse: (!) 120  Weight: 171 lb (77.6 kg)  Body mass index is 31.28 kg/m.           Physical Examination:   General appearance: alert, well appearing, and in no distress  Mental status: alert, oriented to person, place, and time  Skin: warm & dry   Extremities: Edema: None    Cardiovascular: normal heart rate noted  Respiratory: normal respiratory effort, no distress  Abdomen: gravid, soft, non-tender  Pelvic: Cervical exam deferred         Fetal Status: Fetal Heart Rate (bpm): 152 Fundal Height: 30 cm Movement: Present    Fetal Surveillance Testing today: doppler    No results found for this or any previous visit (from the past 24 hour(s)).  Assessment & Plan:  High-risk pregnancy: G3P1011 at [redacted]w[redacted]d with an Estimated Date of Delivery: 09/27/21   1) GDM now A2, rx Metformin 1000mg  q AM to start tomorrow; will need to start ante testing next week and needs growth u/s @ 32 and 36wks  2) Prev C/S, planning for TOLAC; needs 36wk visit with MD  for consent  Meds:  Meds ordered this encounter  Medications   metFORMIN (GLUCOPHAGE) 1000 MG tablet    Sig: Take 1 tablet (1,000 mg total) by mouth daily with breakfast.    Dispense:  30 tablet    Refill:  5    Order Specific Question:   Supervising Provider    Answer:   Janyth Pupa VP:[id number]    Labs/procedures today: none  Treatment Plan:  start ante testing next week with growth @ 32 and 36wks; IOL 39-39.5wks  Reviewed: Preterm labor symptoms and general obstetric precautions including but not limited to vaginal bleeding, contractions, leaking of fluid and fetal movement were reviewed in detail with the patient.  All questions were answered. Does have home bp cuff. Office bp cuff given:  not applicable. Check bp weekly, let us know if consistently >140 and/or >90.  Follow-up: Return in about 2 weeks (around 08/11/2021) for start BPPs next week with growth u/s on 1st one; sched out.   No future appointments.  Orders Placed This Encounter  Procedures   US OB Follow Up   US Fetal BPP W/O Non Stress   [facility] 07/28/2021 12:47 PM

## 2021-08-01 ENCOUNTER — Encounter: Payer: Self-pay | Admitting: Advanced Practice Midwife

## 2021-08-03 DIAGNOSIS — Z029 Encounter for administrative examinations, unspecified: Secondary | ICD-10-CM

## 2021-08-04 ENCOUNTER — Ambulatory Visit: Payer: PRIVATE HEALTH INSURANCE | Attending: Advanced Practice Midwife

## 2021-08-04 ENCOUNTER — Ambulatory Visit: Payer: PRIVATE HEALTH INSURANCE | Admitting: *Deleted

## 2021-08-04 VITALS — BP 113/77 | HR 102

## 2021-08-04 DIAGNOSIS — O0990 Supervision of high risk pregnancy, unspecified, unspecified trimester: Secondary | ICD-10-CM

## 2021-08-04 DIAGNOSIS — O24419 Gestational diabetes mellitus in pregnancy, unspecified control: Secondary | ICD-10-CM

## 2021-08-07 ENCOUNTER — Other Ambulatory Visit: Payer: Self-pay | Admitting: Women's Health

## 2021-08-07 ENCOUNTER — Encounter: Payer: Self-pay | Admitting: Obstetrics & Gynecology

## 2021-08-07 ENCOUNTER — Telehealth (INDEPENDENT_AMBULATORY_CARE_PROVIDER_SITE_OTHER): Payer: BC Managed Care – PPO | Admitting: Obstetrics & Gynecology

## 2021-08-07 VITALS — Wt 169.0 lb

## 2021-08-07 DIAGNOSIS — O0993 Supervision of high risk pregnancy, unspecified, third trimester: Secondary | ICD-10-CM

## 2021-08-07 DIAGNOSIS — O24410 Gestational diabetes mellitus in pregnancy, diet controlled: Secondary | ICD-10-CM

## 2021-08-07 DIAGNOSIS — Z3A32 32 weeks gestation of pregnancy: Secondary | ICD-10-CM

## 2021-08-07 DIAGNOSIS — O0990 Supervision of high risk pregnancy, unspecified, unspecified trimester: Secondary | ICD-10-CM

## 2021-08-07 DIAGNOSIS — O24419 Gestational diabetes mellitus in pregnancy, unspecified control: Secondary | ICD-10-CM

## 2021-08-07 NOTE — Progress Notes (Signed)
I connected with TROY TUNICK 08/07/21 at 10:10 AM EDT by: MyChart video and verified that I am speaking with the correct person using two identifiers.  Patient is located at in her car and provider is located at office.     I discussed the limitations, risks, security and privacy concerns of performing an evaluation and management service by MyChart video and the availability of in person appointments. I also discussed with the patient that there may be a patient responsible charge related to this service. By engaging in this virtual visit, you consent to the provision of healthcare.  Additionally, you authorize for your insurance to be billed for the services provided during this visit.  The patient expressed understanding and agreed to proceed.  The following staff members participated in the virtual visit:     PRENATAL VISIT NOTE  Subjective:  Kristin Blake is a 31 y.o. G3P1011 at [redacted]w[redacted]d  for virtual visit for ongoing prenatal care.  She is currently monitored for the following issues for this high-risk pregnancy and has Migraine headache without aura; Secondary amenorrhea; History of partial thyroidectomy; Polycystic ovarian syndrome; Generalized anxiety disorder; Previous cesarean section; History of gestational diabetes in prior pregnancy, currently pregnant; Supervision of high risk pregnancy, antepartum; History of oligohydramnios in prior pregnancy, currently pregnant; and GDM, class A2 on their problem list.  Patient reports no complaints.  Contractions: Irritability. Vag. Bleeding: None.  Movement: Present. Denies leaking of fluid.   The following portions of the patient's history were reviewed and updated as appropriate: allergies, current medications, past family history, past medical history, past social history, past surgical history and problem list.   Objective:   Vitals:   08/07/21 1014  Weight: 169 lb (76.7 kg)   Self-Obtained  Fetal Status:     Movement: Present      Assessment and Plan:  Pregnancy: G3P1011 at [redacted]w[redacted]d 1. Supervision of high risk pregnancy, antepartum   2. GDM, class A2 On metformin 1000 mg in am, CBG are all good  Preterm labor symptoms and general obstetric precautions including but not limited to vaginal bleeding, contractions, leaking of fluid and fetal movement were reviewed in detail with the patient.  Return for keep scheduled.  Future Appointments  Date Time Provider [place]  08/11/2021 11:10 AM [facility] NURSE [facility]  08/11/2021 11:30 AM Janyth Pupa, DO [facility]  08/15/2021 12:00 PM [place] [facility] [place] [facility] None  08/15/2021  1:30 PM Florian Buff, MD [facility]  08/25/2021 11:10 AM [facility] NURSE [facility]  08/25/2021 11:30 AM Chancy Milroy, MD [facility]  09/01/2021 12:00 PM [place] - [facility] [place] [facility] None  09/01/2021 12:50 PM Janyth Pupa, DO [facility]  09/08/2021 10:00 AM [facility] [place] [facility] None  09/08/2021 11:10 AM Florian Buff, MD [facility]  09/15/2021 10:00 AM [facility] [place] [facility] None  09/15/2021 11:10 AM Florian Buff, MD [facility]  09/22/2021 10:00 AM [facility] [place] [facility] None  09/22/2021 10:50 AM Janyth Pupa, DO [facility]  09/26/2021 10:00 AM [facility] [place] [facility] None  09/26/2021 10:50 AM Janyth Pupa, DO [facility]     Time spent on virtual visit: 10 minutes  Florian Buff, MD

## 2021-08-11 ENCOUNTER — Encounter: Payer: Self-pay | Admitting: Obstetrics & Gynecology

## 2021-08-11 ENCOUNTER — Other Ambulatory Visit: Payer: BC Managed Care – PPO

## 2021-08-11 ENCOUNTER — Other Ambulatory Visit: Payer: Self-pay | Admitting: Advanced Practice Midwife

## 2021-08-11 ENCOUNTER — Ambulatory Visit (INDEPENDENT_AMBULATORY_CARE_PROVIDER_SITE_OTHER): Payer: BC Managed Care – PPO | Admitting: Obstetrics & Gynecology

## 2021-08-11 VITALS — BP 107/75 | HR 93 | Wt 169.0 lb

## 2021-08-11 DIAGNOSIS — Z331 Pregnant state, incidental: Secondary | ICD-10-CM

## 2021-08-11 DIAGNOSIS — O0990 Supervision of high risk pregnancy, unspecified, unspecified trimester: Secondary | ICD-10-CM

## 2021-08-11 DIAGNOSIS — Z1389 Encounter for screening for other disorder: Secondary | ICD-10-CM

## 2021-08-11 DIAGNOSIS — O24419 Gestational diabetes mellitus in pregnancy, unspecified control: Secondary | ICD-10-CM

## 2021-08-11 LAB — POCT URINALYSIS DIPSTICK OB
Blood, UA: NEGATIVE
Glucose, UA: NEGATIVE
Leukocytes, UA: NEGATIVE
Nitrite, UA: NEGATIVE

## 2021-08-11 NOTE — Progress Notes (Signed)
HIGH-RISK PREGNANCY VISIT Patient name: Kristin Blake MRN [id number]  Date of birth: 28-Apr-1990 Chief Complaint:   High Risk Gestation, Routine Prenatal Visit, and Non-stress Test (Low back pain)  History of Present Illness:   Kristin Blake is a 31 y.o. G31P1011 female at 76w2dwith an Estimated Date of Delivery: 09/27/21 being seen today for ongoing management of a high-risk pregnancy complicated by:  GDMA2- sugars well controlled Taking metformin 10070mdaily TOLAC- prior C/S due to fetal intolerance Does not want another child, but does not want BTL  Today she reports no complaints.   Contractions: Not present.  .  Movement: Present. denies leaking of fluid.      03/16/2021    2:08 PM 04/07/2020    8:54 AM  Depression screen PHQ 2/9  Decreased Interest 0 0  Down, Depressed, Hopeless 0 0  PHQ - 2 Score 0 0  Altered sleeping 1 1  Tired, decreased energy 1 1  Change in appetite 0 0  Feeling bad or failure about yourself  0 0  Trouble concentrating 0 0  Moving slowly or fidgety/restless 0 0  Suicidal thoughts 0 0  PHQ-9 Score 2 2     Current Outpatient Medications  Medication Instructions   aspirin EC 81 mg, Oral, Daily, Swallow whole.   Blood Glucose Monitoring Suppl (ACCU-CHEK GUIDE ME) w/Device KIT Use as directed to check sugar QID.   cetirizine (ZYRTEC) 10 mg, Oral, Daily   Doxylamine Succinate, Sleep, (UNISOM PO) Oral   glucose blood (ACCU-CHEK GUIDE) test strip Use as instructed to check sugar QID   metFORMIN (GLUCOPHAGE) 1,000 mg, Oral, Daily with breakfast   Microlet Lancets MISC USE TO TEST BLOOD SUGAR 4 TIMES DAILY.   Prenatal Vit-Fe Fumarate-FA (PRENATAL VITAMIN PO) Oral   sertraline (ZOLOFT) 100 mg, Oral, Daily   sertraline (ZOLOFT) 100 mg, Oral, Daily   sertraline (ZOLOFT) 100 mg, Oral, Daily     Review of Systems:   Pertinent items are noted in HPI Denies abnormal vaginal discharge w/ itching/odor/irritation, headaches, visual changes, shortness  of breath, chest pain, abdominal pain, severe nausea/vomiting, or problems with urination or bowel movements unless otherwise stated above. Pertinent History Reviewed:  Reviewed past medical,surgical, social, obstetrical and family history.  Reviewed problem list, medications and allergies. Physical Assessment:   Vitals:   08/11/21 1108  BP: 107/75  Pulse: 93  Weight: 169 lb (76.7 kg)  Body mass index is 30.91 kg/m.           Physical Examination:   General appearance: alert, well appearing, and in no distress  Mental status: normal mood, behavior, speech, dress, motor activity, and thought processes  Skin: warm & dry   Extremities: Edema: None    Cardiovascular: normal heart rate noted  Respiratory: normal respiratory effort, no distress  Abdomen: gravid, soft, non-tender  Pelvic: Cervical exam deferred         Fetal Status:     Movement: Present    Fetal Surveillance Testing today: NST  NST being performed due to GDMA2   Fetal Monitoring:  Baseline: 150 bpm, Variability: moderate, Accelerations: present, The accelerations are >15 bpm and more than 2 in 20 minutes, and Decelerations: Absent     Final diagnosis:   Reactive NST    Chaperone: N/A    Results for orders placed or performed in visit on 08/11/21 (from the past 24 hour(s))  POC Urinalysis Dipstick OB   Collection Time: 08/11/21 11:20 AM  Result Value Ref Range  Color, UA     Clarity, UA     Glucose, UA Negative Negative   Bilirubin, UA     Ketones, UA trace    Spec Grav, UA     Blood, UA neg    pH, UA     POC,PROTEIN,UA Trace Negative, Trace, Small (1+), Moderate (2+), Large (3+), 4+   Urobilinogen, UA     Nitrite, UA neg    Leukocytes, UA Negative Negative   Appearance     Odor       Assessment & Plan:  High-risk pregnancy: G3P1011 at 19w2dwith an Estimated Date of Delivery: 09/27/21   1) GDMA2 -continue with metformin -continue antepartum testing and growth scans  2) TOLAC -reviewed  risk/benefit as well as alternative scheduled surgery -questions and concerns were addressed -form reviewed and consent obtained/signed  Meds: No orders of the defined types were placed in this encounter.   Labs/procedures today: NST  Treatment Plan:  as outlined above  Reviewed: Preterm labor symptoms and general obstetric precautions including but not limited to vaginal bleeding, contractions, leaking of fluid and fetal movement were reviewed in detail with the patient.  All questions were answered. PT has home bp cuff. Check bp weekly, let [place]now if >140/90.   Follow-up: No follow-ups on file.   Future Appointments  Date Time Provider [place] 08/15/2021 12:00 PM [facility] [place] None  08/15/2021  1:30 PM EFlorian Buff MD [facility]-FT FTOBGYN  08/25/2021 11:10 AM [facility]-FTOBGYN NURSE [facility]TO[facility]YN  08/25/2021 11:30 AM EChancy Milroy MD [facility] FTOBGYN  09/01/2021 12:00 PM [facility] FTOBGYN [place] None  09/01/2021 12:50 PM OJanyth Pupa DO [facility]-FT FTOBGYN  09/08/2021 10:00 AM [facility]YN [place] None  09/08/2021 11:10 AM EFlorian Buff MD [facility]-FT FTOBGYN  09/15/2021 10:00 AM [facility] [place] None  09/15/2021 11:10 AM EFlorian Buff MD [facility]FT FTOBGYN  09/22/2021 10:00 AM [facility] [place] None  09/22/2021 10:50 AM OJanyth Pupa DO [facility]FT FTOBGYN  09/26/2021 10:00 AM [facility] [place] None  09/26/2021 10:50 AM OJanyth Pupa DO [facility]FT FTOBGYN    Orders Placed This Encounter  Procedures   POC Urinalysis Dipstick OB    JJanyth Pupa DO Attending [place] F[place]mitvillefor WDe[employer] CDexterGroup

## 2021-08-15 ENCOUNTER — Ambulatory Visit (INDEPENDENT_AMBULATORY_CARE_PROVIDER_SITE_OTHER): Payer: BC Managed Care – PPO | Admitting: Obstetrics & Gynecology

## 2021-08-15 ENCOUNTER — Ambulatory Visit (INDEPENDENT_AMBULATORY_CARE_PROVIDER_SITE_OTHER): Payer: BC Managed Care – PPO

## 2021-08-15 VITALS — BP 124/80 | HR 108 | Wt 172.0 lb

## 2021-08-15 DIAGNOSIS — O403XX0 Polyhydramnios, third trimester, not applicable or unspecified: Secondary | ICD-10-CM

## 2021-08-15 DIAGNOSIS — O24419 Gestational diabetes mellitus in pregnancy, unspecified control: Secondary | ICD-10-CM

## 2021-08-15 DIAGNOSIS — O0990 Supervision of high risk pregnancy, unspecified, unspecified trimester: Secondary | ICD-10-CM

## 2021-08-15 DIAGNOSIS — Z3A33 33 weeks gestation of pregnancy: Secondary | ICD-10-CM | POA: Diagnosis not present

## 2021-08-15 DIAGNOSIS — Z98891 History of uterine scar from previous surgery: Secondary | ICD-10-CM

## 2021-08-15 NOTE — Progress Notes (Signed)
[place] 33+6 wks,cephalic,BPP 8/8,anterior placenta gr 0,AFI 23.6 cm,FHR 166 bpm,EFW 2232 g 36%

## 2021-08-15 NOTE — Progress Notes (Signed)
HIGH-RISK PREGNANCY VISIT Patient name: Kristin Blake MRN [id number]  Date of birth: 27-Jul-1990 Chief Complaint:   Routine Prenatal Visit  History of Present Illness:   Kristin Blake is a 31 y.o. G7P1011 female at [redacted]w[redacted]d with an Estimated Date of Delivery: 09/27/21 being seen today for ongoing management of a high-risk pregnancy complicated by A2DM-->metformin 1000 mg change to taking at noon to manage her 2 hours post supper.    Today she reports no complaints. Contractions: Not present. Vag. Bleeding: None.  Movement: Present. denies leaking of fluid.      03/16/2021    2:08 PM 04/07/2020    8:54 AM  Depression screen PHQ 2/9  Decreased Interest 0 0  Down, Depressed, Hopeless 0 0  PHQ - 2 Score 0 0  Altered sleeping 1 1  Tired, decreased energy 1 1  Change in appetite 0 0  Feeling bad or failure about yourself  0 0  Trouble concentrating 0 0  Moving slowly or fidgety/restless 0 0  Suicidal thoughts 0 0  PHQ-9 Score 2 2        03/16/2021    2:08 PM 04/07/2020    8:56 AM  GAD 7 : Generalized Anxiety Score  Nervous, Anxious, on Edge 1 1  Control/stop worrying 0 0  Worry too much - different things 1 1  Trouble relaxing 0 0  Restless 0 0  Easily annoyed or irritable 0 0  Afraid - awful might happen 0 0  Total GAD 7 Score 2 2     Review of Systems:   Pertinent items are noted in HPI Denies abnormal vaginal discharge w/ itching/odor/irritation, headaches, visual changes, shortness of breath, chest pain, abdominal pain, severe nausea/vomiting, or problems with urination or bowel movements unless otherwise stated above. Pertinent History Reviewed:  Reviewed past medical,surgical, social, obstetrical and family history.  Reviewed problem list, medications and allergies. Physical Assessment:   Vitals:   08/15/21 1334  BP: 124/80  Pulse: (!) 108  Weight: 172 lb (78 kg)  Body mass index is 31.46 kg/m.           Physical Examination:   General appearance: alert,  well appearing, and in no distress  Mental status: alert, oriented to person, place, and time  Skin: warm & dry   Extremities: Edema: None    Cardiovascular: normal heart rate noted  Respiratory: normal respiratory effort, no distress  Abdomen: gravid, soft, non-tender  Pelvic: Cervical exam deferred         Fetal Status:     Movement: Present    Fetal Surveillance Testing today: BPP 8/8, borderline poly   Chaperone: N/A    No results found for this or any previous visit (from the past 24 hour(s)).  Assessment & Plan:  High-risk pregnancy: G3P1011 at [redacted]w[redacted]d with an Estimated Date of Delivery: 09/27/21      ICD-10-CM   1. Supervision of high risk pregnancy, antepartum  O09.90     2. GDM, class A2  O24.419    switcht the metformin to 1000 mg at lunch    3. Previous cesarean section  E83.151    TOLAC papers signed    4. Borderline Polyhydramnios affecting pregnancy in third trimester  O40.3XX0          Meds: No orders of the defined types were placed in this encounter.   Orders: No orders of the defined types were placed in this encounter.    Labs/procedures today: U/S  Treatment Plan:  weekly BPP + visits  Reviewed: Preterm labor symptoms and general obstetric precautions including but not limited to vaginal bleeding, contractions, leaking of fluid and fetal movement were reviewed in detail with the patient.  All questions were answered. Does have home bp cuff. Office bp cuff given: not applicable. Check bp weekly, let us know if consistently >140 and/or >90.  Follow-up: No follow-ups on file.   Future Appointments  Date Time Provider Department Center  08/25/2021 11:10 AM [facility]FTOBGYN NURSE [facility]TO[facility]YN  08/25/2021 11:30 AM Hermina Staggers, MD [facility] FTOBGYN  09/01/2021 12:00 PM [facility] - [facility]TOBGYN [place] [facility] None  09/01/2021 12:50 PM Myna Hidalgo, DO [facility] FTOBGYN  09/08/2021 10:00 AM [facility] - [facility]TOBGYN [place] [facility] None  09/08/2021 11:10 AM Lazaro Arms, MD [facility]-FT  FTOBGYN  09/15/2021 10:00 AM [facility] - [facility]TOBGYN [place] [facility] None  09/15/2021 11:10 AM Lazaro Arms, MD [facility] FTOBGYN  09/22/2021 10:00 AM [facility] - [facility]TOBGYN [place] [facility] None  09/22/2021 10:50 AM Myna Hidalgo, DO [facility] FTOBGYN  09/26/2021 10:00 AM [facility] - [facility]TO[facility]YN [place] [facility] None  09/26/2021 10:50 AM Myna Hidalgo, DO [facility] FTOBGYN    No orders of the defined types were placed in this encounter.  Lazaro Arms  Attending Physician for the Center for [facility] 08/15/2021 2:12 PM

## 2021-08-25 ENCOUNTER — Encounter: Payer: Self-pay | Admitting: Obstetrics and Gynecology

## 2021-08-25 ENCOUNTER — Ambulatory Visit (INDEPENDENT_AMBULATORY_CARE_PROVIDER_SITE_OTHER): Payer: PRIVATE HEALTH INSURANCE | Admitting: Obstetrics and Gynecology

## 2021-08-25 ENCOUNTER — Other Ambulatory Visit: Payer: PRIVATE HEALTH INSURANCE

## 2021-08-25 VITALS — BP 114/81 | HR 90 | Wt 171.6 lb

## 2021-08-25 DIAGNOSIS — O0990 Supervision of high risk pregnancy, unspecified, unspecified trimester: Secondary | ICD-10-CM

## 2021-08-25 DIAGNOSIS — Z331 Pregnant state, incidental: Secondary | ICD-10-CM

## 2021-08-25 DIAGNOSIS — O24419 Gestational diabetes mellitus in pregnancy, unspecified control: Secondary | ICD-10-CM

## 2021-08-25 DIAGNOSIS — Z1389 Encounter for screening for other disorder: Secondary | ICD-10-CM

## 2021-08-25 DIAGNOSIS — O288 Other abnormal findings on antenatal screening of mother: Secondary | ICD-10-CM

## 2021-08-25 DIAGNOSIS — Z98891 History of uterine scar from previous surgery: Secondary | ICD-10-CM

## 2021-08-25 LAB — POCT URINALYSIS DIPSTICK OB
Blood, UA: NEGATIVE
Glucose, UA: NEGATIVE
Ketones, UA: NEGATIVE
Leukocytes, UA: NEGATIVE
Nitrite, UA: NEGATIVE

## 2021-08-25 MED ORDER — METFORMIN HCL 500 MG PO TABS: ORAL_TABLET | ORAL | 5 refills | Status: DC

## 2021-08-29 ENCOUNTER — Encounter: Payer: Self-pay | Admitting: Obstetrics and Gynecology

## 2021-08-29 ENCOUNTER — Encounter: Payer: Self-pay | Admitting: *Deleted

## 2021-08-31 ENCOUNTER — Other Ambulatory Visit: Payer: Self-pay | Admitting: Advanced Practice Midwife

## 2021-08-31 DIAGNOSIS — O24419 Gestational diabetes mellitus in pregnancy, unspecified control: Secondary | ICD-10-CM

## 2021-09-01 ENCOUNTER — Ambulatory Visit (INDEPENDENT_AMBULATORY_CARE_PROVIDER_SITE_OTHER): Payer: BC Managed Care – PPO

## 2021-09-01 ENCOUNTER — Other Ambulatory Visit: Payer: BC Managed Care – PPO

## 2021-09-01 ENCOUNTER — Other Ambulatory Visit (HOSPITAL_COMMUNITY)
Admission: RE | Admit: 2021-09-01 | Discharge: 2021-09-01 | Disposition: A | Discharge status: Home / Self Care | Payer: PRIVATE HEALTH INSURANCE | Source: Ambulatory Visit | Attending: Obstetrics & Gynecology | Admitting: Obstetrics & Gynecology

## 2021-09-01 ENCOUNTER — Ambulatory Visit (INDEPENDENT_AMBULATORY_CARE_PROVIDER_SITE_OTHER): Payer: BC Managed Care – PPO | Admitting: Obstetrics & Gynecology

## 2021-09-01 ENCOUNTER — Encounter: Payer: Self-pay | Admitting: Obstetrics & Gynecology

## 2021-09-01 ENCOUNTER — Encounter: Payer: BC Managed Care – PPO | Admitting: Obstetrics & Gynecology

## 2021-09-01 VITALS — BP 107/73 | HR 101 | Wt 173.0 lb

## 2021-09-01 DIAGNOSIS — O24419 Gestational diabetes mellitus in pregnancy, unspecified control: Secondary | ICD-10-CM

## 2021-09-01 DIAGNOSIS — Z3A36 36 weeks gestation of pregnancy: Secondary | ICD-10-CM

## 2021-09-01 DIAGNOSIS — O0990 Supervision of high risk pregnancy, unspecified, unspecified trimester: Secondary | ICD-10-CM | POA: Diagnosis present

## 2021-09-01 LAB — POCT URINALYSIS DIPSTICK OB
Blood, UA: NEGATIVE
Ketones, UA: NEGATIVE
Leukocytes, UA: NEGATIVE
Nitrite, UA: NEGATIVE
POC,PROTEIN,UA: NEGATIVE

## 2021-09-01 NOTE — Progress Notes (Signed)
HIGH-RISK PREGNANCY VISIT Patient name: Kristin Blake MRN [id number]  Date of birth: Mar 12, 1990 Chief Complaint:   Routine Prenatal Visit  History of Present Illness:   Kristin Blake is a 31 y.o. G1P1011 female at 9w2dwith an Estimated Date of Delivery: 09/27/21 being seen today for ongoing management of a high-risk pregnancy complicated by: -GDMA2- on metformin 1000 qam and 500 evening -prior C-section, desires TOLAC  Today she reports occasional contractions.   Contractions: Not present. Vag. Bleeding: None.  Movement: Present. denies leaking of fluid.      03/16/2021    2:08 PM 04/07/2020    8:54 AM  Depression screen PHQ 2/9  Decreased Interest 0 0  Down, Depressed, Hopeless 0 0  PHQ - 2 Score 0 0  Altered sleeping 1 1  Tired, decreased energy 1 1  Change in appetite 0 0  Feeling bad or failure about yourself  0 0  Trouble concentrating 0 0  Moving slowly or fidgety/restless 0 0  Suicidal thoughts 0 0  PHQ-9 Score 2 2     Current Outpatient Medications  Medication Instructions   aspirin EC 81 mg, Oral, Daily, Swallow whole.   Blood Glucose Monitoring Suppl (ACCU-CHEK GUIDE ME) w/Device KIT Use as directed to check sugar QID.   cetirizine (ZYRTEC) 10 mg, Oral, Daily   Doxylamine Succinate, Sleep, (UNISOM PO) Oral   glucose blood (ACCU-CHEK GUIDE) test strip Use as instructed to check sugar QID   metFORMIN (GLUCOPHAGE) 500 MG tablet 1000 mg with breakfast and 500 mg with evening meal   Microlet Lancets MISC USE TO TEST BLOOD SUGAR 4 TIMES DAILY.   Prenatal Vit-Fe Fumarate-FA (PRENATAL VITAMIN PO) Oral   sertraline (ZOLOFT) 100 mg, Oral, Daily     Review of Systems:   Pertinent items are noted in HPI Denies abnormal vaginal discharge w/ itching/odor/irritation, headaches, visual changes, shortness of breath, chest pain, abdominal pain, severe nausea/vomiting, or problems with urination or bowel movements unless otherwise stated above. Pertinent History  Reviewed:  Reviewed past medical,surgical, social, obstetrical and family history.  Reviewed problem list, medications and allergies. Physical Assessment:   Vitals:   09/01/21 1233  BP: 107/73  Pulse: (!) 101  Weight: 173 lb (78.5 kg)  Body mass index is 31.64 kg/m.           Physical Examination:   General appearance: alert, well appearing, and in no distress  Mental status: normal mood, behavior, speech, dress, motor activity, and thought processes  Skin: warm & dry   Extremities: Edema: None    Cardiovascular: normal heart rate noted  Respiratory: normal respiratory effort, no distress  Abdomen: gravid, soft, non-tender  Pelvic: Cervical exam performed  Dilation: Fingertip Effacement (%): Thick Station: -3  Fetal Status: Fetal Heart Rate (bpm): 138 u/s   Movement: Present Presentation: Vertex  Fetal Surveillance Testing today: BPP 8/8,FHR 138 bpm,anterior placenta gr 1,AFI 29.8 cm,polyhydramnios   Chaperone: N/A    Results for orders placed or performed in visit on 09/01/21 (from the past 24 hour(s))  POC Urinalysis Dipstick OB   Collection Time: 09/01/21 12:35 PM  Result Value Ref Range   Color, UA     Clarity, UA     Glucose, UA Trace (A) Negative   Bilirubin, UA     Ketones, UA neg    Spec Grav, UA     Blood, UA neg    pH, UA     POC,PROTEIN,UA Negative Negative, Trace, Small (1+), Moderate (2+), Large (3+),  4+   Urobilinogen, UA     Nitrite, UA neg    Leukocytes, UA Negative Negative   Appearance     Odor       Assessment & Plan:  High-risk pregnancy: G3P1011 at 90w2dwith an Estimated Date of Delivery: 09/27/21   1) GDMA2 -occasional elevated, but mostly wnl -continue with current medication  2) Polyhydramnios -continue BPP weekly  3) Prior C-section, desires TOLAC  Meds: No orders of the defined types were placed in this encounter.   Labs/procedures today: GBS, GC/C obtained  Treatment Plan:  continue antepartum testing and weekly visits.  For  now IOL @ 39wk or as clinically indicated  Reviewed: Preterm labor symptoms and general obstetric precautions including but not limited to vaginal bleeding, contractions, leaking of fluid and fetal movement were reviewed in detail with the patient.  All questions were answered. Pt has home bp cuff. Check bp weekly, let [place]now if >140/90.   Follow-up: Return in about 1 week (around 09/08/2021) for HROB visit and weekly BPP.   Future Appointments  Date Time Provider DC[place] 09/08/2021 10:00 AM [facility] - FTOBGYN [place] None  09/08/2021 11:10 AM EFlorian Buff MD [facility]-FT FTOBGYN  09/15/2021 10:00 AM [facility] - FTOBGYN [place] None  09/15/2021 11:10 AM EFlorian Buff MD [facility]-FT FTOBGYN  09/22/2021 10:00 AM [facility] - FTOBGYN [place] None  09/22/2021 10:50 AM OJanyth Pupa DO [facility]-FT FTOBGYN  09/26/2021 10:00 AM [facility] - FTOBGYN [place] None  09/26/2021 10:50 AM OJanyth Pupa DO [facility]-FT FTOBGYN    Orders Placed This Encounter  Procedures   Culture, beta strep (group b only)   POC Urinalysis Dipstick OB    JJanyth Pupa DO Attending [place] FUniversity Placefor WDe[employer] CVera

## 2021-09-01 NOTE — Progress Notes (Signed)
[place] 36+2 wks,BPP 8/8,FHR 138 bpm,anterior placenta gr 1,AFI 29.8 cm,polyhydramnios

## 2021-09-04 LAB — CERVICOVAGINAL ANCILLARY ONLY
Chlamydia: NEGATIVE
Comment: NEGATIVE
Comment: NORMAL
Neisseria Gonorrhea: NEGATIVE

## 2021-09-05 ENCOUNTER — Encounter: Payer: PRIVATE HEALTH INSURANCE | Admitting: Nurse Practitioner

## 2021-09-05 LAB — CULTURE, BETA STREP (GROUP B ONLY): Strep Gp B Culture: NEGATIVE

## 2021-09-06 ENCOUNTER — Other Ambulatory Visit (INDEPENDENT_AMBULATORY_CARE_PROVIDER_SITE_OTHER): Payer: PRIVATE HEALTH INSURANCE

## 2021-09-06 ENCOUNTER — Other Ambulatory Visit: Payer: Self-pay | Admitting: Advanced Practice Midwife

## 2021-09-06 ENCOUNTER — Other Ambulatory Visit: Payer: PRIVATE HEALTH INSURANCE

## 2021-09-06 VITALS — BP 130/88 | HR 101

## 2021-09-06 DIAGNOSIS — R319 Hematuria, unspecified: Secondary | ICD-10-CM

## 2021-09-06 DIAGNOSIS — R350 Frequency of micturition: Secondary | ICD-10-CM | POA: Diagnosis not present

## 2021-09-06 DIAGNOSIS — O2343 Unspecified infection of urinary tract in pregnancy, third trimester: Secondary | ICD-10-CM

## 2021-09-06 DIAGNOSIS — O0990 Supervision of high risk pregnancy, unspecified, unspecified trimester: Secondary | ICD-10-CM

## 2021-09-06 LAB — POCT URINALYSIS DIPSTICK OB
Glucose, UA: NEGATIVE
Ketones, UA: NEGATIVE
Leukocytes, UA: NEGATIVE
Nitrite, UA: NEGATIVE
POC,PROTEIN,UA: NEGATIVE

## 2021-09-06 MED ORDER — CEPHALEXIN 500 MG PO CAPS: 500.0000 mg | ORAL_CAPSULE | Freq: Four times a day (QID) | ORAL | 0 refills | Status: DC

## 2021-09-06 NOTE — Progress Notes (Signed)
   NURSE VISIT- UTI SYMPTOMS   SUBJECTIVE:  Kristin Blake is a 31 y.o. G73P1011 female here for UTI symptoms. She is [redacted]w[redacted]d pregnant. She reports flank pain on the right, hematuria, and urinary frequency.  OBJECTIVE:  BP 130/88 (BP Location: Right Arm, Patient Position: Sitting, Cuff Size: Normal)   Pulse (!) 101   LMP 04/22/2020 (Exact Date)   Appears well, in no apparent distress  Results for orders placed or performed in visit on 09/06/21 (from the past 24 hour(s))  POC Urinalysis Dipstick OB   Collection Time: 09/06/21 11:30 AM  Result Value Ref Range   Color, UA     Clarity, UA     Glucose, UA Negative Negative   Bilirubin, UA     Ketones, UA neg    Spec Grav, UA     Blood, UA moderate    pH, UA     POC,PROTEIN,UA Negative Negative, Trace, Small (1+), Moderate (2+), Large (3+), 4+   Urobilinogen, UA     Nitrite, UA neg    Leukocytes, UA Negative Negative   Appearance     Odor      ASSESSMENT: Pregnancy [redacted]w[redacted]d with UTI symptoms and negative nitrites  PLAN: Discussed with Philipp Deputy, CNM   Rx sent by provider today: Yes Urine culture sent Call or return to clinic prn if these symptoms worsen or fail to improve as anticipated. Follow-up: as scheduled   Jobe Marker  09/06/2021 11:31 AM

## 2021-09-06 NOTE — Progress Notes (Signed)
Marnisha,  Thank you for submitting an e-visit request. Based on your symptoms we would prefer you call your OBGYN for treatment today. Because you are pregnant and noticing blood in your urine it would be best for you to have an in person evaluation today, typically your OBGYN can do this.   If you are having a difficult time getting an appointment with your OB you can visit one of our Urgent Care locations:   NOTE: There will be NO CHARGE for this eVisit   If you are having a true medical emergency please call 911.      For an urgent face to face visit, [place] has seven urgent care centers for your convenience:     [facility] Health [facility] Care Center at [facility] Directions [phone] [address] [place], [place] [address]    [facility] Health Urgent Care Center [facility]) Get Driving Directions [phone] [address] [place], [place] [address]  [facility] Health Urgent Care Center [facility]) Get Driving Directions [phone] [address] [place],  [place]  [address]  [facility] Health Urgent Care Center [facility] Directions  [phone] [address] [place],  [place] [address]   [facility] Health Urgent Care at [facility] Get Driving Directions [phone] [address] [place], [place] [address]   [facility] Health Urgent Care at [facility] Get Driving Directions  [phone] [address] [place], [place] [address]   [facility] Health Urgent Care at [facility] Directions [phone] [address] [place], [place] [address]  Your MyChart E-visit questionnaire answers were reviewed by a board certified advanced clinical practitioner to complete your personal care plan based on your specific symptoms.  Thank you for using e-Visits.

## 2021-09-07 ENCOUNTER — Other Ambulatory Visit: Payer: Self-pay | Admitting: Obstetrics & Gynecology

## 2021-09-07 DIAGNOSIS — O24419 Gestational diabetes mellitus in pregnancy, unspecified control: Secondary | ICD-10-CM

## 2021-09-07 DIAGNOSIS — O409XX0 Polyhydramnios, unspecified trimester, not applicable or unspecified: Secondary | ICD-10-CM

## 2021-09-08 ENCOUNTER — Ambulatory Visit (INDEPENDENT_AMBULATORY_CARE_PROVIDER_SITE_OTHER): Payer: BC Managed Care – PPO | Admitting: Obstetrics & Gynecology

## 2021-09-08 ENCOUNTER — Ambulatory Visit (INDEPENDENT_AMBULATORY_CARE_PROVIDER_SITE_OTHER): Payer: BC Managed Care – PPO

## 2021-09-08 ENCOUNTER — Encounter: Payer: Self-pay | Admitting: Obstetrics & Gynecology

## 2021-09-08 VITALS — BP 121/86 | HR 110 | Wt 174.0 lb

## 2021-09-08 DIAGNOSIS — O24419 Gestational diabetes mellitus in pregnancy, unspecified control: Secondary | ICD-10-CM

## 2021-09-08 DIAGNOSIS — O0990 Supervision of high risk pregnancy, unspecified, unspecified trimester: Secondary | ICD-10-CM

## 2021-09-08 DIAGNOSIS — Z3A37 37 weeks gestation of pregnancy: Secondary | ICD-10-CM

## 2021-09-08 DIAGNOSIS — O409XX0 Polyhydramnios, unspecified trimester, not applicable or unspecified: Secondary | ICD-10-CM

## 2021-09-08 DIAGNOSIS — O403XX0 Polyhydramnios, third trimester, not applicable or unspecified: Secondary | ICD-10-CM

## 2021-09-08 LAB — POCT URINALYSIS DIPSTICK OB
Glucose, UA: NEGATIVE
Leukocytes, UA: NEGATIVE
Nitrite, UA: NEGATIVE

## 2021-09-08 LAB — URINE CULTURE

## 2021-09-08 MED ORDER — OMEPRAZOLE 20 MG PO CPDR: 20.0000 mg | DELAYED_RELEASE_CAPSULE | Freq: Every day | ORAL | 6 refills | Status: DC

## 2021-09-08 NOTE — Progress Notes (Signed)
HIGH-RISK PREGNANCY VISIT Patient name: Kristin Blake MRN [id number]  Date of birth: 1990-08-22 Chief Complaint:   Routine Prenatal Visit  History of Present Illness:   Kristin Blake is a 31 y.o. G38P1011 female at [redacted]w[redacted]d with an Estimated Date of Delivery: 09/27/21 being seen today for ongoing management of a high-risk pregnancy complicated by Class A2 DM with polyhydramnios.    Today she reports no complaints. Contractions: Not present. Vag. Bleeding: None.  Movement: Present. denies leaking of fluid.      03/16/2021    2:08 PM 04/07/2020    8:54 AM  Depression screen PHQ 2/9  Decreased Interest 0 0  Down, Depressed, Hopeless 0 0  PHQ - 2 Score 0 0  Altered sleeping 1 1  Tired, decreased energy 1 1  Change in appetite 0 0  Feeling bad or failure about yourself  0 0  Trouble concentrating 0 0  Moving slowly or fidgety/restless 0 0  Suicidal thoughts 0 0  PHQ-9 Score 2 2        03/16/2021    2:08 PM 04/07/2020    8:56 AM  GAD 7 : Generalized Anxiety Score  Nervous, Anxious, on Edge 1 1  Control/stop worrying 0 0  Worry too much - different things 1 1  Trouble relaxing 0 0  Restless 0 0  Easily annoyed or irritable 0 0  Afraid - awful might happen 0 0  Total GAD 7 Score 2 2     Review of Systems:   Pertinent items are noted in HPI Denies abnormal vaginal discharge w/ itching/odor/irritation, headaches, visual changes, shortness of breath, chest pain, abdominal pain, severe nausea/vomiting, or problems with urination or bowel movements unless otherwise stated above. Pertinent History Reviewed:  Reviewed past medical,surgical, social, obstetrical and family history.  Reviewed problem list, medications and allergies. Physical Assessment:   Vitals:   09/08/21 1051  BP: 121/86  Pulse: (!) 110  Weight: 174 lb (78.9 kg)  Body mass index is 31.83 kg/m.           Physical Examination:   General appearance: alert, well appearing, and in no distress  Mental status:  alert, oriented to person, place, and time  Skin: warm & dry   Extremities: Edema: None    Cardiovascular: normal heart rate noted  Respiratory: normal respiratory effort, no distress  Abdomen: gravid, soft, non-tender  Pelvic: Cervical exam deferred         Fetal Status:     Movement: Present    Fetal Surveillance Testing today: BPP 8/8   Chaperone: N/A    Results for orders placed or performed in visit on 09/08/21 (from the past 24 hour(s))  POC Urinalysis Dipstick OB   Collection Time: 09/08/21 10:55 AM  Result Value Ref Range   Color, UA     Clarity, UA     Glucose, UA Negative Negative   Bilirubin, UA     Ketones, UA small    Spec Grav, UA     Blood, UA     pH, UA     POC,PROTEIN,UA Small (1+) Negative, Trace, Small (1+), Moderate (2+), Large (3+), 4+   Urobilinogen, UA     Nitrite, UA neg    Leukocytes, UA Negative Negative   Appearance     Odor      Assessment & Plan:  High-risk pregnancy: G3P1011 at [redacted]w[redacted]d with an Estimated Date of Delivery: 09/27/21      ICD-10-CM   1. Supervision of  high risk pregnancy, antepartum  O09.90 POC Urinalysis Dipstick OB    2. GDM, class A2  O24.419 POC Urinalysis Dipstick OB   metformin 1000 + 500-->good control    3. Polyhydramnios affecting pregnancy in third trimester  O40.3XX0         Meds:  Meds ordered this encounter  Medications   omeprazole (PRILOSEC) 20 MG capsule    Sig: Take 1 capsule (20 mg total) by mouth daily. 1 tablet a day    Dispense:  30 capsule    Refill:  6    Orders:  Orders Placed This Encounter  Procedures   POC Urinalysis Dipstick OB     Labs/procedures today: U/S  Treatment Plan:  twice weekly surveillance, IOL 39 weeks    Follow-up: Return in about 4 days (around 09/12/2021) for NST, Nurse only.   Future Appointments  Date Time Provider Department Center  09/15/2021 10:00 AM [facility] - [facility]TOBGYN [place] [facility] None  09/15/2021 11:10 AM Lazaro Arms, MD [facility]-FT FTOBGYN  09/22/2021 10:00 AM  [facility] - FTOBGYN [place] [facility] None  09/22/2021 10:50 AM Myna Hidalgo, DO [facility]-FT FTOBGYN  09/26/2021 10:00 AM [facility] - FTOBGYN [place] [facility] None  09/26/2021 10:50 AM Myna Hidalgo, DO [facility]FT FTOBGYN    Orders Placed This Encounter  Procedures   POC Urinalysis Dipstick OB   Lazaro Arms  Attending Physician for the Center for [facility] 09/08/2021 12:16 PM

## 2021-09-08 NOTE — Progress Notes (Signed)
[place] 37+2 wks,cephalic,FHR 150 bpm,BPP 8/8,polyhydramnios,AFI 30 cm,EFW 3065 g 48%,anterior placenta gr 3

## 2021-09-12 ENCOUNTER — Ambulatory Visit (INDEPENDENT_AMBULATORY_CARE_PROVIDER_SITE_OTHER): Payer: PRIVATE HEALTH INSURANCE | Admitting: *Deleted

## 2021-09-12 VITALS — BP 115/82 | HR 107 | Wt 173.0 lb

## 2021-09-12 DIAGNOSIS — Z331 Pregnant state, incidental: Secondary | ICD-10-CM

## 2021-09-12 DIAGNOSIS — Z1389 Encounter for screening for other disorder: Secondary | ICD-10-CM

## 2021-09-12 DIAGNOSIS — O288 Other abnormal findings on antenatal screening of mother: Secondary | ICD-10-CM | POA: Diagnosis not present

## 2021-09-12 DIAGNOSIS — O24419 Gestational diabetes mellitus in pregnancy, unspecified control: Secondary | ICD-10-CM | POA: Diagnosis not present

## 2021-09-12 DIAGNOSIS — O0990 Supervision of high risk pregnancy, unspecified, unspecified trimester: Secondary | ICD-10-CM

## 2021-09-12 LAB — POCT URINALYSIS DIPSTICK OB
Blood, UA: NEGATIVE
Glucose, UA: NEGATIVE
Leukocytes, UA: NEGATIVE
Nitrite, UA: NEGATIVE

## 2021-09-12 NOTE — Progress Notes (Signed)
   NURSE VISIT- NST  SUBJECTIVE:  Kristin Blake is a 31 y.o. G45P1011 female at [redacted]w[redacted]d, here for a NST for pregnancy complicated by A2DM currently on Metformin .  She reports active fetal movement, contractions: "mild cramping", vaginal bleeding: none, membranes: intact.   OBJECTIVE:  BP 115/82   Pulse (!) 107   Wt 173 lb (78.5 kg)   LMP 04/22/2020 (Exact Date)   BMI 31.64 kg/m   Appears well, no apparent distress  Results for orders placed or performed in visit on 09/12/21 (from the past 24 hour(s))  POC Urinalysis Dipstick OB   Collection Time: 09/12/21 10:53 AM  Result Value Ref Range   Color, UA     Clarity, UA     Glucose, UA Negative Negative   Bilirubin, UA     Ketones, UA small    Spec Grav, UA     Blood, UA neg    pH, UA     POC,PROTEIN,UA Small (1+) Negative, Trace, Small (1+), Moderate (2+), Large (3+), 4+   Urobilinogen, UA     Nitrite, UA neg    Leukocytes, UA Negative Negative   Appearance     Odor      NST: FHR baseline 150 bpm, Variability: moderate, Accelerations:present, Decelerations:  Absent= Cat 1/reactive Toco: occasional   ASSESSMENT: G3P1011 at [redacted]w[redacted]d with A2DM currently on Metformin NST reactive  PLAN: EFM strip reviewed by Joellyn Haff, CNM, [facility]   Recommendations: keep next appointment as scheduled    Jobe Marker  09/12/2021 11:34 AM

## 2021-09-13 ENCOUNTER — Other Ambulatory Visit: Payer: Self-pay | Admitting: Advanced Practice Midwife

## 2021-09-13 DIAGNOSIS — O24419 Gestational diabetes mellitus in pregnancy, unspecified control: Secondary | ICD-10-CM

## 2021-09-14 ENCOUNTER — Other Ambulatory Visit: Payer: Self-pay | Admitting: Obstetrics & Gynecology

## 2021-09-14 DIAGNOSIS — O24419 Gestational diabetes mellitus in pregnancy, unspecified control: Secondary | ICD-10-CM

## 2021-09-14 DIAGNOSIS — O409XX0 Polyhydramnios, unspecified trimester, not applicable or unspecified: Secondary | ICD-10-CM

## 2021-09-15 ENCOUNTER — Inpatient Hospital Stay (HOSPITAL_COMMUNITY): Payer: PRIVATE HEALTH INSURANCE | Admitting: Anesthesiology

## 2021-09-15 ENCOUNTER — Encounter (HOSPITAL_COMMUNITY): Payer: Self-pay | Admitting: Family Medicine

## 2021-09-15 ENCOUNTER — Other Ambulatory Visit: Payer: BC Managed Care – PPO

## 2021-09-15 ENCOUNTER — Encounter: Payer: BC Managed Care – PPO | Admitting: Obstetrics & Gynecology

## 2021-09-15 ENCOUNTER — Inpatient Hospital Stay (HOSPITAL_COMMUNITY)
Admission: AD | Admit: 2021-09-15 | Discharge: 2021-09-18 | DRG: 806 | Disposition: A | Discharge status: Home / Self Care | Payer: PRIVATE HEALTH INSURANCE | Attending: Obstetrics & Gynecology | Admitting: Obstetrics & Gynecology

## 2021-09-15 DIAGNOSIS — O99893 Other specified diseases and conditions complicating puerperium: Secondary | ICD-10-CM | POA: Diagnosis not present

## 2021-09-15 DIAGNOSIS — O34211 Maternal care for low transverse scar from previous cesarean delivery: Secondary | ICD-10-CM | POA: Diagnosis not present

## 2021-09-15 DIAGNOSIS — Z3A38 38 weeks gestation of pregnancy: Secondary | ICD-10-CM

## 2021-09-15 DIAGNOSIS — Z833 Family history of diabetes mellitus: Secondary | ICD-10-CM | POA: Diagnosis not present

## 2021-09-15 DIAGNOSIS — O403XX0 Polyhydramnios, third trimester, not applicable or unspecified: Secondary | ICD-10-CM | POA: Diagnosis present

## 2021-09-15 DIAGNOSIS — O34219 Maternal care for unspecified type scar from previous cesarean delivery: Principal | ICD-10-CM | POA: Diagnosis present

## 2021-09-15 DIAGNOSIS — F411 Generalized anxiety disorder: Secondary | ICD-10-CM | POA: Diagnosis present

## 2021-09-15 DIAGNOSIS — O24419 Gestational diabetes mellitus in pregnancy, unspecified control: Principal | ICD-10-CM

## 2021-09-15 DIAGNOSIS — R030 Elevated blood-pressure reading, without diagnosis of hypertension: Secondary | ICD-10-CM | POA: Diagnosis not present

## 2021-09-15 DIAGNOSIS — Z98891 History of uterine scar from previous surgery: Secondary | ICD-10-CM

## 2021-09-15 DIAGNOSIS — O24414 Gestational diabetes mellitus in pregnancy, insulin controlled: Secondary | ICD-10-CM | POA: Diagnosis not present

## 2021-09-15 DIAGNOSIS — O24425 Gestational diabetes mellitus in childbirth, controlled by oral hypoglycemic drugs: Principal | ICD-10-CM | POA: Diagnosis present

## 2021-09-15 DIAGNOSIS — O0990 Supervision of high risk pregnancy, unspecified, unspecified trimester: Secondary | ICD-10-CM

## 2021-09-15 DIAGNOSIS — O26893 Other specified pregnancy related conditions, third trimester: Secondary | ICD-10-CM | POA: Diagnosis present

## 2021-09-15 DIAGNOSIS — O99344 Other mental disorders complicating childbirth: Secondary | ICD-10-CM | POA: Diagnosis present

## 2021-09-15 DIAGNOSIS — Z8632 Personal history of gestational diabetes: Secondary | ICD-10-CM

## 2021-09-15 LAB — CBC
HCT: 33.2 % — ABNORMAL LOW (ref 36.0–46.0)
Hemoglobin: 10.6 g/dL — ABNORMAL LOW (ref 12.0–15.0)
MCH: 29.4 pg (ref 26.0–34.0)
MCHC: 31.9 g/dL (ref 30.0–36.0)
MCV: 92.2 fL (ref 80.0–100.0)
Platelets: 170 10*3/uL (ref 150–400)
RBC: 3.6 MIL/uL — ABNORMAL LOW (ref 3.87–5.11)
RDW: 14.5 % (ref 11.5–15.5)
WBC: 9.7 10*3/uL (ref 4.0–10.5)
nRBC: 0 % (ref 0.0–0.2)

## 2021-09-15 LAB — TYPE AND SCREEN
ABO/RH(D): O POS
Antibody Screen: NEGATIVE

## 2021-09-15 LAB — GLUCOSE, CAPILLARY
Glucose-Capillary: 86 mg/dL (ref 70–99)
Glucose-Capillary: 89 mg/dL (ref 70–99)
Glucose-Capillary: 61 mg/dL — ABNORMAL LOW (ref 70–99)
Glucose-Capillary: 88 mg/dL (ref 70–99)
Glucose-Capillary: 106 mg/dL — ABNORMAL HIGH (ref 70–99)
Glucose-Capillary: 95 mg/dL (ref 70–99)

## 2021-09-15 LAB — RPR: RPR Ser Ql: NONREACTIVE

## 2021-09-15 LAB — POCT FERN TEST: POCT Fern Test: POSITIVE

## 2021-09-15 MED ORDER — LIDOCAINE HCL (PF) 1 % IJ SOLN: 30.0000 mL | INTRAMUSCULAR | Status: DC | PRN

## 2021-09-15 MED ORDER — EPHEDRINE 5 MG/ML INJ: 10.0000 mg | INTRAVENOUS | Status: DC | PRN

## 2021-09-15 MED ORDER — OXYCODONE-ACETAMINOPHEN 5-325 MG PO TABS: 1.0000 | ORAL_TABLET | ORAL | Status: DC | PRN

## 2021-09-15 MED ORDER — DIPHENHYDRAMINE HCL 50 MG/ML IJ SOLN: 12.5000 mg | INTRAMUSCULAR | Status: DC | PRN

## 2021-09-15 MED ORDER — FENTANYL CITRATE (PF) 100 MCG/2ML IJ SOLN: 100.0000 ug | INTRAMUSCULAR | Status: DC | PRN

## 2021-09-15 MED ORDER — LACTATED RINGERS IV SOLN
500.0000 mL | INTRAVENOUS | Status: DC | PRN
  Administered 2021-09-15: 500 mL via INTRAVENOUS

## 2021-09-15 MED ORDER — LACTATED RINGERS IV SOLN
INTRAVENOUS | Status: DC
  Administered 2021-09-15 (×2): 125 mL/h via INTRAVENOUS

## 2021-09-15 MED ORDER — ACETAMINOPHEN 325 MG PO TABS: 650.0000 mg | ORAL_TABLET | ORAL | Status: DC | PRN

## 2021-09-15 MED ORDER — LACTATED RINGERS IV SOLN: 500.0000 mL | Freq: Once | INTRAVENOUS | Status: DC

## 2021-09-15 MED ORDER — OXYCODONE-ACETAMINOPHEN 5-325 MG PO TABS: 2.0000 | ORAL_TABLET | ORAL | Status: DC | PRN

## 2021-09-15 MED ORDER — PHENYLEPHRINE 80 MCG/ML (10ML) SYRINGE FOR IV PUSH (FOR BLOOD PRESSURE SUPPORT): 80.0000 ug | PREFILLED_SYRINGE | INTRAVENOUS | Status: DC | PRN

## 2021-09-15 MED ORDER — PHENYLEPHRINE 80 MCG/ML (10ML) SYRINGE FOR IV PUSH (FOR BLOOD PRESSURE SUPPORT)
80.0000 ug | PREFILLED_SYRINGE | INTRAVENOUS | Status: DC | PRN
  Filled 2021-09-15: qty 10

## 2021-09-15 MED ORDER — OXYTOCIN BOLUS FROM INFUSION
333.0000 mL | Freq: Once | INTRAVENOUS | Status: AC
  Administered 2021-09-16: 333 mL via INTRAVENOUS

## 2021-09-15 MED ORDER — SOD CITRATE-CITRIC ACID 500-334 MG/5ML PO SOLN: 30.0000 mL | ORAL | Status: DC | PRN

## 2021-09-15 MED ORDER — OXYTOCIN-SODIUM CHLORIDE 30-0.9 UT/500ML-% IV SOLN
2.5000 [IU]/h | INTRAVENOUS | Status: DC
  Administered 2021-09-16: 2.5 [IU]/h via INTRAVENOUS
  Filled 2021-09-15 (×2): qty 500

## 2021-09-15 MED ORDER — ONDANSETRON HCL 4 MG/2ML IJ SOLN: 4.0000 mg | Freq: Four times a day (QID) | INTRAMUSCULAR | Status: DC | PRN

## 2021-09-15 MED ORDER — FENTANYL-BUPIVACAINE-NACL 0.5-0.125-0.9 MG/250ML-% EP SOLN
12.0000 mL/h | EPIDURAL | Status: DC | PRN
  Administered 2021-09-15: 12 mL/h via EPIDURAL
  Filled 2021-09-15: qty 250

## 2021-09-15 MED ORDER — SERTRALINE HCL 100 MG PO TABS
100.0000 mg | ORAL_TABLET | Freq: Every day | ORAL | Status: DC
  Filled 2021-09-15 (×3): qty 1

## 2021-09-15 MED ORDER — LIDOCAINE HCL (PF) 1 % IJ SOLN
INTRAMUSCULAR | Status: DC | PRN
  Administered 2021-09-15: 10 mL via EPIDURAL

## 2021-09-15 NOTE — Progress Notes (Signed)
BS 61. Pt alert and oriented x 4, GCS 15. Pt given apple juice and popsicle. Will reasess blood sugar once PO's are in.

## 2021-09-15 NOTE — Anesthesia Preprocedure Evaluation (Signed)
Anesthesia Evaluation  Patient identified by MRN, date of birth, ID band Patient awake    Reviewed: Allergy & Precautions, H&P , NPO status , Patient's Chart, lab work & pertinent test results, reviewed documented beta blocker date and time   Airway Mallampati: I  TM Distance: >3 FB Neck ROM: full    Dental no notable dental hx. (+) Teeth Intact, Dental Advisory Given   Pulmonary neg pulmonary ROS,    Pulmonary exam normal breath sounds clear to auscultation       Cardiovascular negative cardio ROS Normal cardiovascular exam Rhythm:regular Rate:Normal     Neuro/Psych negative neurological ROS  negative psych ROS   GI/Hepatic negative GI ROS, Neg liver ROS,   Endo/Other  negative endocrine ROSdiabetes, Gestational  Renal/GU negative Renal ROS  negative genitourinary   Musculoskeletal   Abdominal   Peds  Hematology negative hematology ROS (+)   Anesthesia Other Findings   Reproductive/Obstetrics (+) Pregnancy                             Anesthesia Physical Anesthesia Plan  ASA: 3  Anesthesia Plan: Epidural   Post-op Pain Management: Minimal or no pain anticipated   Induction:   PONV Risk Score and Plan: 2  Airway Management Planned: Natural Airway  Additional Equipment: None  Intra-op Plan:   Post-operative Plan:   Informed Consent: I have reviewed the patients History and Physical, chart, labs and discussed the procedure including the risks, benefits and alternatives for the proposed anesthesia with the patient or authorized representative who has indicated his/her understanding and acceptance.     Dental Advisory Given  Plan Discussed with: Anesthesiologist  Anesthesia Plan Comments: (Labs checked- platelets confirmed with RN in room. Fetal heart tracing, per RN, reported to be stable enough for sitting procedure. Discussed epidural, and patient consents to the  procedure:  included risk of possible headache,backache, failed block, allergic reaction, and nerve injury. This patient was asked if she had any questions or concerns before the procedure started.)        Anesthesia Quick Evaluation

## 2021-09-15 NOTE — MAU Note (Signed)
Vertex confirmed via Bedside US performed by Gerrit Heck, CNM

## 2021-09-15 NOTE — Progress Notes (Signed)
Kristin Blake is a 31 y.o. G3P1011 at [redacted]w[redacted]d admitted for SROM.  Subjective: Kristin Blake is resting comfortably with epidural. No concerns at this time.   Objective: BP 108/69   Pulse (!) 106   Temp 97.6 F (36.4 C) (Oral)   Resp 16   LMP 04/22/2020 (Exact Date)   SpO2 98%  I/O last 3 completed shifts: In: -  Out: 1150 [Urine:1150] No intake/output data recorded.  FHT: 155 bpm, moderate variability, +15x15 accels, no decels UC: Q4-5 mins SVE:   Dilation: Lip/rim Effacement (%): 100 Station: 0 Exam by:: J MIddleton RN  Labs: Lab Results  Component Value Date   WBC 9.7 09/15/2021   HGB 10.6 (L) 09/15/2021   HCT 33.2 (L) 09/15/2021   MCV 92.2 09/15/2021   PLT 170 09/15/2021    Assessment / Plan: Kristin Blake is a 31 y.o. G3P1011 at [redacted]w[redacted]d admitted for SROM  Labor: Progressing. Continue expectant management A2GDM: Q4H CBGs; CBG's have been wnl Fetal Wellbeing:  Category I Pain Control:  Epidural I/D:  GBS neg Anticipated MOD:  NSVD   Brand Males, CNM 09/15/2021, 8:48 PM

## 2021-09-15 NOTE — MAU Note (Signed)
CBG on Admission, 86

## 2021-09-15 NOTE — Progress Notes (Signed)
BAHAR SHELDEN is a 31 y.o. G3P1011 at [redacted]w[redacted]d admitted for SROM.  Subjective: Aracelys is doing well. She is comfortable with epidural. Reports intermittent pelvic pressure.  Objective: BP 114/74   Pulse 95   Temp 97.6 F (36.4 C) (Oral)   Resp 16   LMP 04/22/2020 (Exact Date)   SpO2 100%  No intake/output data recorded. Total I/O In: -  Out: 800 [Urine:800]  FHT: 145 bpm, moderate variability, +15x15 accels, +early decels UC: Q2-5 mins SVE:   Dilation: 6 Effacement (%): 90 Station: -1 Exam by:: [employer] RN  Labs: Lab Results  Component Value Date   WBC 9.7 09/15/2021   HGB 10.6 (L) 09/15/2021   HCT 33.2 (L) 09/15/2021   MCV 92.2 09/15/2021   PLT 170 09/15/2021    Assessment / Plan: Shamariah Shewmake is a 31 y.o. G3P1011 at [redacted]w[redacted]d admitted for SROM  Labor: Progressing. Continue expectant management A2GDM: Q4H CBGs; CBG's have been wnl Fetal Wellbeing:  Category I Pain Control:  Epidural I/D:  GBS neg Anticipated MOD:  NSVD   Brand Males, CNM 09/15/2021, 3:23 PM

## 2021-09-15 NOTE — Anesthesia Procedure Notes (Signed)
Epidural Patient location during procedure: OB Start time: 09/15/2021 7:49 AM End time: 09/15/2021 7:54 AM  Staffing Anesthesiologist: Bethena Midget, MD  Preanesthetic Checklist Completed: patient identified, IV checked, site marked, risks and benefits discussed, surgical consent, monitors and equipment checked, pre-op evaluation and timeout performed  Epidural Patient position: sitting Prep: DuraPrep and site prepped and draped Patient monitoring: continuous pulse ox and blood pressure Approach: midline Location: L3-L4 Injection technique: LOR air  Needle:  Needle type: Tuohy  Needle gauge: 17 G Needle length: 9 cm and 9 Needle insertion depth: 7 cm Catheter type: closed end flexible Catheter size: 19 Gauge Catheter at skin depth: 13 cm Test dose: negative  Assessment Events: blood not aspirated, injection not painful, no injection resistance, no paresthesia and negative IV test

## 2021-09-15 NOTE — Progress Notes (Signed)
Patient Vitals for the past 4 hrs:  BP Temp Temp src Pulse Resp SpO2  09/15/21 0640 (!) 127/92 -- -- 90 20 --  09/15/21 0455 (!) 121/92 98.1 F (36.7 C) Oral 92 18 --  09/15/21 0419 124/87 -- -- 93 -- 100 %   Pt says/acts like ctx are definitely increasing in intensity and frequency since SROM, q 2-4 minutes.  Cx exam by RN was challenging d/t being posterior, recheck not deemed necessary at this time.  FHR Cat 1.  Expectant management

## 2021-09-15 NOTE — H&P (Signed)
Kristin Blake is a 31 y.o. female, G3P1011 at 38.2  weeks, presenting for SROM. Patient receives care at [facility] and was supervised for a High risk pregnancy. Pregnancy and medical history significant for problems as listed below. She is GBS negative and expresses a desire for epidural for pain management.  She is anticipating a female infant (for circumcision) and requests pills for PP birth control method.     Patient Active Problem List   Diagnosis Date Noted   GDM, class A2 07/10/2021   History of oligohydramnios in prior pregnancy, currently pregnant 03/17/2021   Previous cesarean section 03/16/2021   History of gestational diabetes in prior pregnancy, currently pregnant 03/16/2021   Supervision of high risk pregnancy, antepartum 03/16/2021   History of partial thyroidectomy 04/08/2020   Polycystic ovarian syndrome 04/08/2020   Generalized anxiety disorder 04/08/2020   Migraine headache without aura 08/14/2017    History of present pregnancy:  Last evaluation:  09/12/2021 in office for NST, which was reactive.  BP 115/82   Pulse (!) 107   Wt 173 lb (78.5 kg)     FAMILY TREE  RESULTS  Language English Pap 04/07/20 neg  Initiated care at 12wks GC/CT Initial: -/-           36wks:  Dating by 6wk U/S    Support person  Genetics NT/IT:     AFP:      Panorama: low risk female  BP cuff  Carrier Screen neg    [place]/Hgb Elec   Rhogam n/a    TDaP vaccine declined Blood Type O/Positive/-- (01/12 1610)  Flu vaccine  Antibody Negative (05/05 0828)  Covid vaccine  HBsAg Negative (01/12 1610)    RPR Non Reactive (05/05 0828)  Anatomy US Nl boy 'Jaime' Rubella  1.07 (01/12 1610)  Feeding Plan Breast HIV Non Reactive (05/05 0932)  Contraception Maybe Phexxi? Hep C   Circumcision     Pediatrician [facility] A1C/GTT Early:      26-28wks: abnormal  Prenatal Classes       GBS   neg  [ ]  PCN allergy  BTL Consent     VBAC Consent  PHQ9 & GAD7  [ ] New OB  [ ] 28wks   [ ] 36wks   Waterbirth [ ] Class [ ]  36wkCNM visit/consent       OB History     Gravida  3   Para  1   Term  1   Preterm      AB  1   Living  1      SAB  1   IAB      Ectopic      Multiple      Live Births  1             Past Medical History:  Diagnosis Date   Gestational diabetes    Kidney stone    Migraine headache without aura    Miscarriage 09/2009   7WKS   Vaginal lesion 1994   Past Surgical History:  Procedure Laterality Date   CESAREAN SECTION     LAPAROSCOPY  2000   THYROIDECTOMY, PARTIAL     Family History: family history includes Cancer in her maternal grandmother and paternal grandfather; Diabetes in her mother; Hyperlipidemia in her father. Social History:  reports that she has never smoked. She has never used smokeless tobacco. She reports that she does not currently use alcohol. She reports that she does not currently use drugs after having used the  following drugs: Marijuana.   Prenatal Transfer Tool  Maternal Diabetes: Yes:  Diabetes Type:  Insulin/Medication controlled Genetic Screening: Normal Maternal Ultrasounds/Referrals: Other: Fetal Ultrasounds or other Referrals:  None Maternal Substance Abuse:  No Significant Maternal Medications:  Meds include: Zoloft Other: Aspirin, Metformin, Prilosec Significant Maternal Lab Results: Group B Strep negative   Maternal Assessment:  ROS: +Contractions, +LOF, -Vaginal Bleeding, +Fetal Movement  All other systems reviewed and negative.    No Known Allergies     Blood pressure 124/87, pulse 93, last menstrual period 04/22/2020, SpO2 100 %.  Physical Exam Vitals reviewed.  Constitutional:      Appearance: Normal appearance.  HENT:     Head: Normocephalic and atraumatic.  Eyes:     Conjunctiva/sclera: Conjunctivae normal.  Cardiovascular:     Rate and Rhythm: Normal rate.  Pulmonary:     Effort: Pulmonary effort is normal. No respiratory distress.  Musculoskeletal:     Cervical  back: Normal range of motion.  Skin:    General: Skin is warm and dry.  Neurological:     Mental Status: She is alert and oriented to person, place, and time.  Psychiatric:        Mood and Affect: Mood normal.        Behavior: Behavior normal.     Fetal Assessment: Leopolds: -Pelvis: Adequate -EFW: 3065 grams on 09/08/2021 -Presentation: Vertex by [place]  Patient informed that the ultrasound is considered a limited OB ultrasound and is not intended to be a complete ultrasound exam.  Patient also informed that the ultrasound is not being completed with the intent of assessing for fetal or placental anomalies or any pelvic abnormalities.  Explained that the purpose of today's ultrasound is to assess for  presentation.  Patient acknowledges the purpose of the exam and the limitations of the study.   FHR: 145 bpm, Mod Var, -Decels, +15x15 Accels UCs:  Q2-28min    Assessment IUP at 38.2 weeks Cat I FT SROM at 0250 GDM-A2 Metformin Polyhydramnios  TOLAC  Plan: Admit to [facility]  Routine Labor and Delivery Orders per Protocol Patient desires epidural CBGs Q4 hrs Zoloft 100mg  at HS Augment as appropriate.  L&D team notified of patient status.    , MSN 09/15/2021, 4:32 AM

## 2021-09-16 ENCOUNTER — Encounter (HOSPITAL_COMMUNITY): Payer: Self-pay | Admitting: Family Medicine

## 2021-09-16 DIAGNOSIS — Z3A38 38 weeks gestation of pregnancy: Secondary | ICD-10-CM

## 2021-09-16 DIAGNOSIS — O24425 Gestational diabetes mellitus in childbirth, controlled by oral hypoglycemic drugs: Secondary | ICD-10-CM

## 2021-09-16 DIAGNOSIS — O34211 Maternal care for low transverse scar from previous cesarean delivery: Secondary | ICD-10-CM

## 2021-09-16 DIAGNOSIS — O34219 Maternal care for unspecified type scar from previous cesarean delivery: Principal | ICD-10-CM | POA: Diagnosis not present

## 2021-09-16 LAB — GLUCOSE, CAPILLARY: Glucose-Capillary: 158 mg/dL — ABNORMAL HIGH (ref 70–99)

## 2021-09-16 MED ORDER — TERBUTALINE SULFATE 1 MG/ML IJ SOLN: 0.2500 mg | Freq: Once | INTRAMUSCULAR | Status: DC | PRN

## 2021-09-16 MED ORDER — METHYLERGONOVINE MALEATE 0.2 MG/ML IJ SOLN
INTRAMUSCULAR | Status: AC
  Administered 2021-09-16: 0.2 mg via INTRAMUSCULAR
  Filled 2021-09-16: qty 1

## 2021-09-16 MED ORDER — OXYCODONE HCL 5 MG PO TABS: 10.0000 mg | ORAL_TABLET | ORAL | Status: DC | PRN

## 2021-09-16 MED ORDER — METHYLERGONOVINE MALEATE 0.2 MG/ML IJ SOLN: 0.2000 mg | Freq: Once | INTRAMUSCULAR | Status: AC

## 2021-09-16 MED ORDER — ACETAMINOPHEN 325 MG PO TABS
650.0000 mg | ORAL_TABLET | ORAL | Status: DC | PRN
  Administered 2021-09-16 – 2021-09-18 (×5): 650 mg via ORAL
  Filled 2021-09-16 (×5): qty 2

## 2021-09-16 MED ORDER — SIMETHICONE 80 MG PO CHEW: 80.0000 mg | CHEWABLE_TABLET | ORAL | Status: DC | PRN

## 2021-09-16 MED ORDER — ONDANSETRON HCL 4 MG PO TABS: 4.0000 mg | ORAL_TABLET | ORAL | Status: DC | PRN

## 2021-09-16 MED ORDER — CEFAZOLIN SODIUM-DEXTROSE 2-4 GM/100ML-% IV SOLN
2.0000 g | Freq: Once | INTRAVENOUS | Status: AC
  Administered 2021-09-16: 2 g via INTRAVENOUS

## 2021-09-16 MED ORDER — IBUPROFEN 600 MG PO TABS
600.0000 mg | ORAL_TABLET | Freq: Four times a day (QID) | ORAL | Status: DC
  Administered 2021-09-16 – 2021-09-18 (×8): 600 mg via ORAL
  Filled 2021-09-16 (×9): qty 1

## 2021-09-16 MED ORDER — PRENATAL MULTIVITAMIN CH
1.0000 | ORAL_TABLET | Freq: Every day | ORAL | Status: DC
Start: 2021-09-16 — End: 2021-09-18
  Administered 2021-09-16 – 2021-09-17 (×2): 1 via ORAL
  Filled 2021-09-16 (×2): qty 1

## 2021-09-16 MED ORDER — CEFAZOLIN SODIUM-DEXTROSE 2-4 GM/100ML-% IV SOLN
2.0000 g | Freq: Three times a day (TID) | INTRAVENOUS | Status: DC
  Filled 2021-09-16: qty 100

## 2021-09-16 MED ORDER — ZOLPIDEM TARTRATE 5 MG PO TABS: 5.0000 mg | ORAL_TABLET | Freq: Every evening | ORAL | Status: DC | PRN

## 2021-09-16 MED ORDER — SENNOSIDES-DOCUSATE SODIUM 8.6-50 MG PO TABS
2.0000 | ORAL_TABLET | Freq: Every day | ORAL | Status: DC
  Administered 2021-09-17 – 2021-09-18 (×2): 2 via ORAL
  Filled 2021-09-16 (×2): qty 2

## 2021-09-16 MED ORDER — OXYTOCIN-SODIUM CHLORIDE 30-0.9 UT/500ML-% IV SOLN
1.0000 m[IU]/min | INTRAVENOUS | Status: DC
  Administered 2021-09-16: 2 m[IU]/min via INTRAVENOUS

## 2021-09-16 MED ORDER — DIBUCAINE (PERIANAL) 1 % EX OINT: 1.0000 | TOPICAL_OINTMENT | CUTANEOUS | Status: DC | PRN

## 2021-09-16 MED ORDER — OXYCODONE HCL 5 MG PO TABS: 5.0000 mg | ORAL_TABLET | ORAL | Status: DC | PRN

## 2021-09-16 MED ORDER — ONDANSETRON HCL 4 MG/2ML IJ SOLN: 4.0000 mg | INTRAMUSCULAR | Status: DC | PRN

## 2021-09-16 MED ORDER — DIPHENHYDRAMINE HCL 25 MG PO CAPS: 25.0000 mg | ORAL_CAPSULE | Freq: Four times a day (QID) | ORAL | Status: DC | PRN

## 2021-09-16 MED ORDER — CEFAZOLIN SODIUM-DEXTROSE 2-4 GM/100ML-% IV SOLN: 2.0000 g | Freq: Once | INTRAVENOUS | Status: DC

## 2021-09-16 MED ORDER — WITCH HAZEL-GLYCERIN EX PADS: 1.0000 | MEDICATED_PAD | CUTANEOUS | Status: DC | PRN

## 2021-09-16 MED ORDER — SERTRALINE HCL 100 MG PO TABS
100.0000 mg | ORAL_TABLET | Freq: Every day | ORAL | Status: DC
  Administered 2021-09-16 – 2021-09-17 (×2): 100 mg via ORAL

## 2021-09-16 MED ORDER — COCONUT OIL OIL: 1.0000 | TOPICAL_OIL | Status: DC | PRN

## 2021-09-16 MED ORDER — TETANUS-DIPHTH-ACELL PERTUSSIS 5-2.5-18.5 LF-MCG/0.5 IM SUSY: 0.5000 mL | PREFILLED_SYRINGE | Freq: Once | INTRAMUSCULAR | Status: DC

## 2021-09-16 MED ORDER — BENZOCAINE-MENTHOL 20-0.5 % EX AERO
1.0000 | INHALATION_SPRAY | CUTANEOUS | Status: DC | PRN
  Administered 2021-09-16: 1 via TOPICAL
  Filled 2021-09-16: qty 56

## 2021-09-16 NOTE — Lactation Note (Signed)
This note was copied from a baby's chart. Lactation Consultation Note  Patient Name: Kristin Blake [id number] Date: 09/16/2021 Reason for consult: Mother's request;Difficult latch;Follow-up assessment Age:33 hours Per mom, she is having painful latches, mom has nipples stripes and abrasions on both breast.  LC observed infant has heart shape tongue when crying and thick short  band of tissue at bottom of tongue tip and floor of infant's mouth (lingual frenulum). Mom attempted use 20 mm NS on her right breast using the football hold position,  but latch was painful  at 10 minutes, and infant was  taken off  the breast and nipple was bloody. LC gave mom hydrogel to apply sore nipple with bruise, abrasion and nipple stripe.  Mom latched infant on other breast ( left ) using the football hold position without NS,  LC worked with flanging  infant's top and bottom lip outward, where mom was more comfortable with latch. Infant was still breastfeeding after 23 minutes when LC left the room. LC set mom up with DEBP, mom will pump 6 times within 24 hours, set on lower setting.  Mom will apply hydrogels after latching infant at the breast and understands they are good up to 24 hours only. Mom knows to call RN/LC for further latch assistance if needed.  Maternal Data    Feeding Mother's Current Feeding Choice: Breast Milk  LATCH Score Latch: Grasps breast easily, tongue down, lips flanged, rhythmical sucking.  Audible Swallowing: Spontaneous and intermittent  Type of Nipple: Everted at rest and after stimulation  Comfort (Breast/Nipple): Engorged, cracked, bleeding, large blisters, severe discomfort  Hold (Positioning): Assistance needed to correctly position infant at breast and maintain latch.  LATCH Score: 7   Lactation Tools Discussed/Used Tools: Pump Breast pump type: Double-Electric Breast Pump Pump Education: Setup, frequency, and cleaning;Milk Storage Reason for Pumping:  Difficult latch, nipples stripes Pumping frequency: Mom will pump 6 x within 24 hours  Interventions Interventions: Assisted with latch;Support pillows;Position options;Expressed milk;Adjust position;Breast compression;Education;Comfort gels;DEBP  Discharge    Consult Status Consult Status: Follow-up Date: 09/17/21 Follow-up type: In-patient    Danelle Earthly 09/16/2021, 11:43 PM

## 2021-09-16 NOTE — Progress Notes (Signed)
CSW received consult for hx of Anxiety and Depression.  CSW met with MOB to offer support and complete assessment.  When CSW entered room, FOB was present. MOB provided verbal consent to speak in front of FOB about anything. CSW introduced self and reason for consult. MOB presented as warm and remained engaged throughout consult.  CSW inquired about MOB's mental health history. MOB reports she was diagnosed with Generalized Anxiety Disorder in 2017, marked by experiencing panic attacks, wanting to sleep due to feeling overwhelmed by tasks, and procrastinating. MOB reports she is unsure if she has experienced depression and attributes depressive symptoms to feeling overwhelmed. MOB shares she had difficulty getting pregnant which was an emotionally difficult experience prior to pregnancy. MOB reports she is prescribed Zoloft through her PCP. MOB reports she has taken Zoloft for 1 year, which she reports as helpful. MOB reports she used to take Xanax PRN prior to becoming pregnant when experiencing difficulty sleeping. MOB reports she also has a current prescription and plans to take Xanax if needed moving forward. MOB reports she has not attended therapy in the past. MOB declined therapy resources at this time. MOB reports her anxiety is well managed via Zoloft and she prefers being prescribed medication through her PCP. MOB reports since giving birth, she has felt "a little emotional because of the rush delivery" but attributes her emotions to being hormone related and due to minimal sleep over the past 2 days. MOB reports "in general, (she) feels "pretty good." MOB denied current SI/HI. DV was not assessed due to FOB being present. MOB shares she has a 10 year old son and denies a history of PPD during her previous pregnancy. MOB identified FOB and "lots of family" as supports. MOB identified talking to people and taking psychotropic medications as coping skills.   MOB reports she has all needed items for  infant, including a car seat and basinet. MOB has chosen [facility] for infant care.   CSW provided education regarding the baby blues period vs. perinatal mood disorders, discussed treatment and gave resources for mental health follow up if concerns arise.  CSW recommends self-evaluation during the postpartum time period using the New Mom Checklist from Postpartum Progress and encouraged MOB to contact a medical professional if symptoms are noted at any time.    CSW provided review of Sudden Infant Death Syndrome (SIDS) precautions.    CSW identifies no further need for intervention and no barriers to discharge at this time.  Signed,  Orris Perin K. Treveon Bourcier, MSW, LCSWA, LCASA 09/16/2021 6:38 PM 

## 2021-09-16 NOTE — Progress Notes (Signed)
[facility] called about patient's need of a storkpump. Order is in.

## 2021-09-16 NOTE — Lactation Note (Signed)
This note was copied from a baby's chart. Lactation Consultation Note  Patient Name: Kristin Blake [id number] Date: 09/16/2021 Reason for consult: Initial assessment;Early term 37-38.6wks;Maternal endocrine disorder Age:33 hours  Visited with mom of 10 hours old ETI female, she's a P2 but this is really her first time breastfeeding, she tried to breastfeeding her first baby 10 years ago but voiced that her milk never came in. Assisted with hand expression and able to get small droplets of colostrum; praised her for her efforts. Baby just started nursing when entered the room, assisted with latching and positioning (see LATCH score) it took a couple of a attempts to reposition baby but he latched and started sucking rhythmically. Reviewed normal newborn behavior, feeding cues, cluster feeding, benefits of STS care and size of baby's stomach.  Maternal Data Has patient been taught Hand Expression?: Yes Does the patient have breastfeeding experience prior to this delivery?: Yes How long did the patient breastfeed?: 1 week  Feeding Mother's Current Feeding Choice: Breast Milk  LATCH Score Latch: Grasps breast easily, tongue down, lips flanged, rhythmical sucking. (with assistance)  Audible Swallowing: A few with stimulation  Type of Nipple: Everted at rest and after stimulation  Comfort (Breast/Nipple): Soft / non-tender  Hold (Positioning): Assistance needed to correctly position infant at breast and maintain latch.  LATCH Score: 8  Interventions Interventions: Breast feeding basics reviewed;Assisted with latch;Skin to skin;Breast massage;Hand express;Breast compression;Adjust position;Support pillows;Education;LC Services brochure  Plan of care Encouraged mom to put baby to breast 8-12 times/24 hours or sooner if feeding cues are present Hand expression and spoon feeding were also encouraged [facility] RN Dorene Grebe to put order for a stork pump per mom's request, she doesn't have a pump for  home use, she has private insurance  FOB present and supportive. All questions and concerns answered, family to contact [facility] services PRN.  Consult Status Consult Status: Follow-up Date: 09/17/21 Follow-up type: In-patient   Kristin Blake Venetia Constable 09/16/2021, 3:57 PM

## 2021-09-16 NOTE — Discharge Summary (Signed)
Postpartum Discharge Summary  Date of Service updated***     Patient Name: Kristin Blake DOB: 1990-03-30 MRN: [id number]  Date of admission: 09/15/2021 Delivery date:09/16/2021  Delivering provider: Genia Blake  Date of discharge: 09/17/2021  Admitting diagnosis: GDM, class A2 [O24.419] Intrauterine pregnancy: [redacted]w[redacted]d    Secondary diagnosis:  Principal Problem:   VBAC (vaginal birth after Cesarean) Active Problems:   Generalized anxiety disorder   Previous cesarean section   Supervision of high risk pregnancy, antepartum   GDM, class A2   Retained placenta   Shoulder dystocia, delivered  Additional problems: ***    Discharge diagnosis: Term Pregnancy Delivered                                              Post partum procedures:{Postpartum procedures:23558} Augmentation: Pitocin Complications: Retained placenta s/p manual extraction, shoulder dystocia   Hospital course: Onset of Labor With Vaginal Delivery      31y.o. yo G3P1011 at 364w3das admitted in Latent Labor on 09/15/2021 after SROM. Patient had a labor course significant for prolonged second staged. She had a VBAC complicated by shoulder dystocia and retained placenta s/p manual extraction.  Membrane Rupture Time/Date: 2:50 AM ,09/15/2021   Delivery Method:VBAC, Spontaneous  Episiotomy: None  Lacerations:  Vaginal  Patient had an uncomplicated postpartum course.  She is ambulating, tolerating a regular diet, passing flatus, and urinating well.  Her fasting CBG postpartum was ***.  She will have a GTT outpatient.  Her pain and bleeding are controlled.  She is ***feeding well.  Patient is discharged home in stable condition on 09/17/21.  Newborn Data: Birth date:09/16/2021  Birth time:4:56 AM  Gender:Female  Living status:Living  Apgars:5 ,8  Weight:3160 g   Magnesium Sulfate received: No BMZ received: No Rhophylac: N/A MMR: N/A T-DaP: Declined Flu: No Transfusion: No  Physical exam  Vitals:    09/16/21 1300 09/16/21 2227 09/17/21 0457 09/17/21 1450  BP: 124/80 118/83 123/87 117/87  Pulse: 78 98 80 78  Resp: 18 18 18 18   Temp: 98.2 F (36.8 C) 97.6 F (36.4 C) 97.7 F (36.5 C) 98.2 F (36.8 C)  TempSrc:  Oral Oral Oral  SpO2: 98% 98% 99% 98%   General: {Exam; general:21111117} Lochia: {Desc; appropriate/inappropriate:30686::"appropriate"} Uterine Fundus: {Desc; firm/soft:30687} Incision: {Exam; incision:21111123} DVT Evaluation: {Exam; dvVVO:1607371}Labs: Lab Results  Component Value Date   WBC 15.4 (H) 09/17/2021   HGB 9.7 (L) 09/17/2021   HCT 29.2 (L) 09/17/2021   MCV 89.6 09/17/2021   PLT 165 09/17/2021      Latest Ref Rng & Units 03/10/2016    9:20 AM  CMP  Glucose 65 - 99 mg/dL 87   BUN 6 - 20 mg/dL 11   Creatinine 0.44 - 1.00 mg/dL 0.91   Sodium 135 - 145 mmol/L 135   Potassium 3.5 - 5.1 mmol/L 3.6   Chloride 101 - 111 mmol/L 103   CO2 22 - 32 mmol/L 25   Calcium 8.9 - 10.3 mg/dL 9.9   Total Protein 6.5 - 8.1 g/dL 8.1   Total Bilirubin 0.3 - 1.2 mg/dL 0.3   Alkaline Phos 38 - 126 U/L 69   AST 15 - 41 U/L 17   ALT 14 - 54 U/L 12    Edinburgh Score:    09/16/2021    8:30 AM  Kristin Blake  Postnatal Depression Scale Screening Tool  I have been able to laugh and see the funny side of things. 0  I have looked forward with enjoyment to things. 0  I have blamed myself unnecessarily when things went wrong. 1  I have been anxious or worried for no good reason. 2  I have felt scared or panicky for no good reason. 0  Things have been getting on top of me. 1  I have been so unhappy that I have had difficulty sleeping. 0  I have felt sad or miserable. 1  I have been so unhappy that I have been crying. 0  The thought of harming myself has occurred to me. 0  Edinburgh Postnatal Depression Scale Total 5    After visit meds:  Allergies as of 09/17/2021   No Known Allergies   Med Rec must be completed prior to using this [facility]***        Durable  Medical Equipment  (From admission, onward)           Start     Ordered   09/16/21 1602  For home use only DME double electric breast pump  Once        09/16/21 1601            Discharge home in stable condition Infant Feeding: {Baby feeding:23562} Infant Disposition: {CHL IP OB HOME WITH OEUMPN:36144} Discharge instruction: per After Visit Summary and Postpartum booklet. Activity: Advance as tolerated. Pelvic rest for 6 weeks.  Diet: routine diet Future Appointments:No future appointments. Follow up Visit: Message sent to FT by Dr. Gwenlyn Perking on 09/16/21.   Please schedule this patient for a In person postpartum visit in 6 weeks with the following provider: Any provider. Additional Postpartum F/U: 2 hour GTT  High risk pregnancy complicated by: GDM Delivery mode:  VBAC, Spontaneous  Anticipated Birth Control:  OCPs  09/17/2021 Kristin Clan, DO

## 2021-09-16 NOTE — Anesthesia Postprocedure Evaluation (Signed)
Anesthesia Post Note  Patient: Kristin Blake  Procedure(s) Performed: AN AD HOC LABOR EPIDURAL     Patient location during evaluation: Mother Baby Anesthesia Type: Epidural Level of consciousness: awake and alert Pain management: pain level controlled Vital Signs Assessment: post-procedure vital signs reviewed and stable Respiratory status: spontaneous breathing, nonlabored ventilation and respiratory function stable Cardiovascular status: stable Postop Assessment: no headache, no backache and epidural receding Anesthetic complications: no   No notable events documented.  Last Vitals:  Vitals:   09/16/21 1051 09/16/21 1300  BP: 131/85 124/80  Pulse: (!) 102 78  Resp:  18  Temp:  36.8 C  SpO2: 99% 98%    Last Pain:  Vitals:   09/16/21 1428  TempSrc:   PainSc: 4    Pain Goal:                   [facility] Records

## 2021-09-16 NOTE — Progress Notes (Signed)
Labor Progress Note Kristin Blake is a 31 y.o. G3P1011 at [redacted]w[redacted]d who presented for SROM.   S: Doing well overall. Feeling more vaginal pain and pressure. Family at bedside.  O:  BP 117/80   Pulse 96   Temp 97.6 F (36.4 C) (Oral)   Resp 18   LMP 04/22/2020 (Exact Date)   SpO2 98%   EFM: Baseline 145 bpm, moderate variability, + accels, no decels  Toco: Every 3-5 minutes   CVE: Dilation: 10 Dilation Complete Date: 09/16/21 Dilation Complete Time: 0006 Effacement (%): 100 Station: Plus 1 Presentation: Vertex Exam by:: Taygen Newsome  A&P: 31 y.o. G3P1011 [redacted]w[redacted]d   #Labor: Progressing well. Now complete and plus 1 station. Feeling lots of pressure. Will start pushing and reassess.  #Pain: Epidural  #FWB: Cat 1  #GBS negative  Worthy Rancher, MD 12:19 AM

## 2021-09-16 NOTE — Progress Notes (Signed)
Assessed patient at bedside. Pushing well. Contractions irregular, every 2-6 minutes. Will add low-dose Pitocin to improve contraction pattern. Will reassess in 30 minutes to assess progress.   Baseline 145 bpm, moderate variability, + accels, variable decels with contractions. Cat 2 - overall reassuring. Will continue to monitor closely.   Evalina Field, MD

## 2021-09-16 NOTE — Progress Notes (Signed)
Assessed patient at bedside. Patient has been pushing for 2 hours and 20 minutes. Patient has progressed to plus 2 station, improved from last exam. Fetal head feels asynclitic. Difficult to fully assess position due to caput but suspect ROP position. Will continue pushing and reassess around 3 hr mark. All questions and concerns addressed.   Baseline 145 bpm, moderate variability, + accels, occasional variable decels. Cat 2 - overall reassuring. Will continue to monitor closely.   Toco: Every 2-4 minutes. On Pitocin 6 milli-units/min.   Evalina Field, MD

## 2021-09-16 NOTE — Progress Notes (Signed)
Dr. Hyacinth Meeker notified of patient's BP of 131/85 she said just to recheck at 2pm and call if its still elevated.

## 2021-09-17 LAB — CBC
HCT: 29.2 % — ABNORMAL LOW (ref 36.0–46.0)
Hemoglobin: 9.7 g/dL — ABNORMAL LOW (ref 12.0–15.0)
MCH: 29.8 pg (ref 26.0–34.0)
MCHC: 33.2 g/dL (ref 30.0–36.0)
MCV: 89.6 fL (ref 80.0–100.0)
Platelets: 165 10*3/uL (ref 150–400)
RBC: 3.26 MIL/uL — ABNORMAL LOW (ref 3.87–5.11)
RDW: 14.8 % (ref 11.5–15.5)
WBC: 15.4 10*3/uL — ABNORMAL HIGH (ref 4.0–10.5)
nRBC: 0 % (ref 0.0–0.2)

## 2021-09-17 LAB — GLUCOSE, CAPILLARY: Glucose-Capillary: 99 mg/dL (ref 70–99)

## 2021-09-17 MED ORDER — FUROSEMIDE 20 MG PO TABS: 20.0000 mg | ORAL_TABLET | Freq: Every day | ORAL | 0 refills | Status: DC

## 2021-09-17 MED ORDER — IBUPROFEN 600 MG PO TABS: 600.0000 mg | ORAL_TABLET | Freq: Four times a day (QID) | ORAL | 0 refills | Status: AC | PRN

## 2021-09-17 MED ORDER — NIFEDIPINE ER OSMOTIC RELEASE 30 MG PO TB24
30.0000 mg | ORAL_TABLET | Freq: Every day | ORAL | Status: DC
  Administered 2021-09-17 – 2021-09-18 (×2): 30 mg via ORAL
  Filled 2021-09-17 (×2): qty 1

## 2021-09-17 MED ORDER — ACETAMINOPHEN 500 MG PO TABS: 1000.0000 mg | ORAL_TABLET | Freq: Three times a day (TID) | ORAL | 0 refills | Status: AC | PRN

## 2021-09-17 MED ORDER — NIFEDIPINE ER OSMOTIC RELEASE 30 MG PO TB24: 30.0000 mg | ORAL_TABLET | Freq: Every day | ORAL | 1 refills | Status: DC

## 2021-09-17 MED ORDER — ACETAMINOPHEN 325 MG PO TABS: 650.0000 mg | ORAL_TABLET | ORAL | Status: DC | PRN

## 2021-09-17 MED ORDER — FUROSEMIDE 20 MG PO TABS
20.0000 mg | ORAL_TABLET | Freq: Every day | ORAL | Status: DC
  Administered 2021-09-17 – 2021-09-18 (×2): 20 mg via ORAL
  Filled 2021-09-17 (×2): qty 1

## 2021-09-17 NOTE — Progress Notes (Signed)
POSTPARTUM PROGRESS NOTE  Post Partum Day 1  Subjective:  Kristin Blake is a 31 y.o. M2L0786 s/p VD at [redacted]w[redacted]d.  She reports she is doing well. No acute events overnight. She denies any problems with ambulating, voiding or po intake. Denies nausea or vomiting.  Pain is well controlled.  Lochia is mild.  Objective: Blood pressure 117/87, pulse 78, temperature 98.2 F (36.8 C), temperature source Oral, resp. rate 18, last menstrual period 04/22/2020, SpO2 98 %, unknown if currently breastfeeding.  Physical Exam:  General: alert, cooperative and no distress Chest: no respiratory distress Heart:regular rate, distal pulses intact Uterine Fundus: firm, appropriately tender DVT Evaluation: No calf swelling or tenderness Extremities: 1+ pitting edema to low shin bilaterally  Skin: warm, dry  Recent Labs    09/15/21 0431 09/17/21 0549  HGB 10.6* 9.7*  HCT 33.2* 29.2*    Assessment/Plan: Kristin Blake is a 31 y.o. [id number] s/p VD at [redacted]w[redacted]d   PPD#1: Doing well  Routine postpartum care  #Elevated blood pressure postpartum: Start procardia and 5 day course of lasix. Initiate postpartum baby scripts. Sent a message to FT for a 1 week BP check.   Contraception: Pills  Feeding: Breast  Dispo: Plan for discharge tomorrow (initially planned for today, but infant is staying). Meds already sent to her pharmacy.    LOS: 2 days   Leticia Penna, DO  OB Fellow  09/17/2021, 4:37 PM

## 2021-09-18 NOTE — Lactation Note (Signed)
This note was copied from a baby's chart. Lactation Consultation Note  Patient Name: Kristin Blake FYTWK'M Date: 09/18/2021 Reason for consult: Follow-up assessment;Early term 37-38.6wks Age:33 hours  Prolonged suck:swallow ratio verified by cervical auscultation. Infant at 8.5% below BW. Mom was amenable to supplementing. Infant did well with the extra-slow flow nipple, but did require some pacing. Infant's tongue was able to grasp bottle teat well, being able to see tongue at the corners of infant's mouth. Good suction and cupping of tongue noted along with good tongue extension with some lateralization noted (with heart-shaped tongue).   Plan Offer breast as desired, following by supplementing (until infant is content). Pump  Parents know how to reach [place] for any post-discharge questions.     Feeding Mother's Current Feeding Choice: Breast Milk and Formula  Interventions Interventions: Education;Adjust position  Discharge Pump: Stork Pump  Consult Status Consult Status: Complete   Remigio Eisenmenger 09/18/2021, 11:17 AM

## 2021-09-18 NOTE — Lactation Note (Signed)
This note was copied from a baby's chart. Lactation Consultation Note  Patient Name: Kristin Blake YIRSW'N Date: 09/18/2021 Reason for consult: Follow-up assessment;Mother's request (-5% weight loss) Age:33 hours Per mom, she is working on infant's latch with positioning, if mom feels pinching or hear clicking noises  with infant's latch , mom is breaking  latch and re-latching infant at the breast. Per mom, infant recently BF for 15 minutes at 2115 pm and again for 15 minutes at 2145 pm, infant currently asleep in his basinet. Per mom, infant has mostly been breastfeeding today and she used the DEBP 1 x today. Mom will continue to breastfeed infant according to cues, on demand, 8 to 12+ times within 24 hours, skin to skin. Mom was given new hydrogels today due having nipple stripes that are healing on both breast.  Mom plans to follow up  her Pediatrician, MD,  regarding infant heart shape tongue and thick short  band of tissue at bottom of  infant's tongue tip, at the floor of infant's mouth (lingual frenulum). Mom was given "Tongue and lip restriction resources".    Maternal Data    Feeding Mother's Current Feeding Choice: Breast Milk  LATCH Score                    Lactation Tools Discussed/Used Tools: Other (comment) (Mom is using hydrogels to help with healing of nipple stripes.)  Interventions Interventions: Position options;DEBP;Education  Discharge    Consult Status Consult Status: Follow-up Date: 09/18/21 Follow-up type: In-patient    Danelle Earthly 09/18/2021, 12:18 AM

## 2021-09-19 LAB — SURGICAL PATHOLOGY

## 2021-09-20 ENCOUNTER — Inpatient Hospital Stay (HOSPITAL_COMMUNITY): Payer: BC Managed Care – PPO

## 2021-09-20 ENCOUNTER — Inpatient Hospital Stay (HOSPITAL_COMMUNITY)
Admission: AD | Admit: 2021-09-20 | Payer: BC Managed Care – PPO | Source: Home / Self Care | Admitting: Obstetrics & Gynecology

## 2021-09-21 ENCOUNTER — Telehealth: Payer: Self-pay

## 2021-09-21 NOTE — Telephone Encounter (Signed)
Spoke with patient. I recommended only checking her bp 1 hour after the medication. If BP elevated after meds she is to let us know. No other questions at this time.

## 2021-09-21 NOTE — Telephone Encounter (Signed)
Patient states that she recently gave birth and was sent home from the hospital with Lasix for her blood pressure.  This morning her blood pressure was 150/100 after she took her medication it went down to 123/87.  Patient wants to speak with a nurse to see if she should come into the office.  Patient has an appointment on Monday.

## 2021-09-22 ENCOUNTER — Other Ambulatory Visit: Payer: BC Managed Care – PPO

## 2021-09-22 ENCOUNTER — Encounter: Payer: BC Managed Care – PPO | Admitting: Obstetrics & Gynecology

## 2021-09-25 ENCOUNTER — Ambulatory Visit (INDEPENDENT_AMBULATORY_CARE_PROVIDER_SITE_OTHER): Payer: PRIVATE HEALTH INSURANCE | Admitting: *Deleted

## 2021-09-25 ENCOUNTER — Encounter: Payer: Self-pay | Admitting: *Deleted

## 2021-09-25 VITALS — BP 115/84 | HR 86 | Ht 62.0 in | Wt 153.0 lb

## 2021-09-25 DIAGNOSIS — Z0130 Encounter for examination of blood pressure without abnormal findings: Secondary | ICD-10-CM

## 2021-09-25 MED ORDER — AMLODIPINE BESYLATE 5 MG PO TABS: 5.0000 mg | ORAL_TABLET | Freq: Every day | ORAL | 2 refills | Status: AC

## 2021-09-25 NOTE — Progress Notes (Addendum)
   NURSE VISIT- BLOOD PRESSURE CHECK  SUBJECTIVE:  Kristin Blake is a 31 y.o. [phone] female here for BP check. She is postpartum, delivery date 09/16/21     HYPERTENSION ROS:  Pregnant/postpartum:  Severe headaches that don't go away with tylenol/other medicines: Yes  Visual changes (seeing spots/double/blurred vision) No  Severe pain under right breast breast or in center of upper chest No  Severe nausea/vomiting No  Taking medicines as instructed yes    OBJECTIVE:  BP 115/84 (BP Location: Left Arm, Patient Position: Sitting, Cuff Size: Normal)   Pulse 86   Ht 5\' 2"  (1.575 m)   Wt 153 lb (69.4 kg)   LMP 04/22/2020 (Exact Date)   Breastfeeding Yes   BMI 27.98 kg/m   Appearance alert, well appearing, and in no distress.  ASSESSMENT: Postpartum  blood pressure check  PLAN: Discussed with 04/24/2020, CNM, [facility]   Recommendations: new prescription will be sent   Follow-up:  Wednesday or Thursday for nurse BP check. Take med 1 hour before appt.     Friday  09/25/2021 11:05 AM  Chart reviewed for nurse visit. Agree with plan of care. Headaches on procardia. Stop and switch to norvasc. F/U in a few days for bp check w/ nurse.  09/27/2021, Cheral Marker 09/25/2021 12:42 PM

## 2021-09-25 NOTE — Addendum Note (Signed)
Addended by: Cheral Marker on: 09/25/2021 12:43 PM   Modules accepted: Orders

## 2021-09-26 ENCOUNTER — Encounter: Payer: BC Managed Care – PPO | Admitting: Obstetrics & Gynecology

## 2021-09-26 ENCOUNTER — Other Ambulatory Visit: Payer: BC Managed Care – PPO

## 2021-09-28 ENCOUNTER — Ambulatory Visit (INDEPENDENT_AMBULATORY_CARE_PROVIDER_SITE_OTHER): Payer: BC Managed Care – PPO | Admitting: *Deleted

## 2021-09-28 ENCOUNTER — Encounter: Payer: Self-pay | Admitting: *Deleted

## 2021-09-28 VITALS — BP 116/87 | HR 72

## 2021-09-28 DIAGNOSIS — Z0130 Encounter for examination of blood pressure without abnormal findings: Secondary | ICD-10-CM

## 2021-09-28 NOTE — Progress Notes (Signed)
   NURSE VISIT- BLOOD PRESSURE CHECK  SUBJECTIVE:  Kristin Blake is a 31 y.o. [phone] female here for BP check. She is postpartum, delivery date 09/16/21     HYPERTENSION ROS:  Pregnant/postpartum:  Severe headaches that don't go away with tylenol/other medicines: No  Visual changes (seeing spots/double/blurred vision) No  Severe pain under right breast breast or in center of upper chest No  Severe nausea/vomiting No  Taking medicines as instructed yes    OBJECTIVE:  BP 116/87 (BP Location: Left Arm, Patient Position: Sitting, Cuff Size: Normal)   Pulse 72   Breastfeeding Yes   Appearance alert, well appearing, and in no distress.  ASSESSMENT: Postpartum  blood pressure check  PLAN: Discussed with Dr. Charlotta Newton   Recommendations: stop medicine 2 days before next visit   Follow-up: as scheduled   Malachy Mood  09/28/2021 12:22 PM

## 2021-10-30 ENCOUNTER — Ambulatory Visit (INDEPENDENT_AMBULATORY_CARE_PROVIDER_SITE_OTHER): Payer: PRIVATE HEALTH INSURANCE | Admitting: Women's Health

## 2021-10-30 ENCOUNTER — Other Ambulatory Visit: Payer: PRIVATE HEALTH INSURANCE

## 2021-10-30 ENCOUNTER — Encounter: Payer: Self-pay | Admitting: Women's Health

## 2021-10-30 DIAGNOSIS — Z8632 Personal history of gestational diabetes: Secondary | ICD-10-CM

## 2021-10-30 DIAGNOSIS — O09299 Supervision of pregnancy with other poor reproductive or obstetric history, unspecified trimester: Secondary | ICD-10-CM

## 2021-10-30 DIAGNOSIS — Z98891 History of uterine scar from previous surgery: Secondary | ICD-10-CM

## 2021-10-30 DIAGNOSIS — Z131 Encounter for screening for diabetes mellitus: Secondary | ICD-10-CM

## 2021-10-30 MED ORDER — SLYND 4 MG PO TABS: 1.0000 | ORAL_TABLET | Freq: Every day | ORAL | 3 refills | Status: DC

## 2021-10-30 NOTE — Progress Notes (Signed)
POSTPARTUM VISIT Patient name: Kristin Blake MRN [id number]  Date of birth: 03/02/1991 Chief Complaint:   Postpartum Care  History of Present Illness:   Kristin Blake is a 31 y.o. G63P2012 Caucasian female being seen today for a postpartum visit. She is 6 weeks postpartum following a vaginal birth after cesarean (VBAC) at 38.3 gestational weeks. IOL: no, for n/a. Anesthesia: epidural.  Laceration: Lt vaginal side wall, hemostatic- not repaired.  Complications: ~0VPX shoulder dystocia resolved w/ McRobert's and counter clockwise rotation, retained placenta w/ manual removal. Inpatient contraception: no.   Pregnancy complicated by T0GY . Tobacco use: no. Substance use disorder: no. Last pap smear: 04/07/20 and results were NILM w/ HRHPV negative. Next pap smear due: 2025 No LMP recorded.  Postpartum course has been complicated by PPHTN, d/c'd on nifedipine and lasix. Switched to norvasc and pp bp check d/t headaches. Has stopped taking as directed. . Bleeding none. Bowel function is normal. Bladder function is normal. Urinary incontinence? no, fecal incontinence? no Patient is not sexually active. Last sexual activity: prior to birth of baby. Desired contraception: husband was planning vasectomy. Pt has PCOS, needs birth control to prevent endometrial proliferation, discussed options, is breastfeeding- wants POPs for now. Patient does not want a pregnancy in the future.  Desired family size is 2 children.   The pregnancy intention screening data noted above was reviewed. Potential methods of contraception were discussed. The patient elected to proceed with No data recorded.  Edinburgh Postpartum Depression Screening: negative  Edinburgh Postnatal Depression Scale - 10/30/21 0900       Edinburgh Postnatal Depression Scale:  In the Past 7 Days   I have been able to laugh and see the funny side of things. 0    I have looked forward with enjoyment to things. 0    I have blamed myself  unnecessarily when things went wrong. 0    I have been anxious or worried for no good reason. 2    I have felt scared or panicky for no good reason. 0    Things have been getting on top of me. 1    I have been so unhappy that I have had difficulty sleeping. 0    I have felt sad or miserable. 0    I have been so unhappy that I have been crying. 0    The thought of harming myself has occurred to me. 0    Edinburgh Postnatal Depression Scale Total 3                03/16/2021    2:08 PM 04/07/2020    8:56 AM  GAD 7 : Generalized Anxiety Score  Nervous, Anxious, on Edge 1 1  Control/stop worrying 0 0  Worry too much - different things 1 1  Trouble relaxing 0 0  Restless 0 0  Easily annoyed or irritable 0 0  Afraid - awful might happen 0 0  Total GAD 7 Score 2 2     Baby's course has been uncomplicated. Baby is feeding by breast and bottle: milk supply adequate. Infant has a pediatrician/family doctor? Yes.  Childcare strategy if returning to work/school: family.  Pt has material needs met for her and baby: Yes.   Review of Systems:   Pertinent items are noted in HPI Denies Abnormal vaginal discharge w/ itching/odor/irritation, headaches, visual changes, shortness of breath, chest pain, abdominal pain, severe nausea/vomiting, or problems with urination or bowel movements. Pertinent History Reviewed:  Reviewed past medical,surgical,  obstetrical and family history.  Reviewed problem list, medications and allergies. OB History  Gravida Para Term Preterm AB Living  _0 SAB IAB Ectopic Multiple Live Births  1     0 2    # Outcome Date GA Lbr Len/2nd Weight Sex Delivery Anes PTL Lv  3 Term 09/16/21 89w3d21:16 / 04:50 6 lb 15.5 oz (3.16 kg) M VBAC EPI  LIV     Birth Comments: WNL  2 Term 05/10/11 353w1d6 lb 2 oz (2.778 kg) M CS-LTranv Gen N LIV     Complications: Fetal Intolerance, Gestational diabetes, Oligohydramnios  1 SAB 09/2009           Physical Assessment:    Vitals:   10/30/21 0900  BP: 114/80  Pulse: 77  Weight: 153 lb (69.4 kg)  Body mass index is 27.98 kg/m.       Physical Examination:   General appearance: alert, well appearing, and in no distress  Mental status: alert, oriented to person, place, and time  Skin: warm & dry   Cardiovascular: normal heart rate noted   Respiratory: normal respiratory effort, no distress   Breasts: deferred, no complaints   Abdomen: soft, non-tender   Pelvic: examination not indicated. Thin prep pap obtained: No  Rectal: not examined  Extremities: Edema: none   Chaperone: N/A         No results found for this or any previous visit (from the past 24 hour(s)).  Assessment & Plan:  1) Postpartum exam 2) 6 wks s/p vaginal birth after cesarean (VBAC) 3) breast & bottle feeding 4) Depression screening 5) Contraception management: rx Slynd to MyScripts, condoms x 2wks 6) A2DM during pregnancy> 2hr GTT today 7) Resolved PPHTN  Essential components of care per ACOG recommendations:  1.  Mood and well being:  If positive depression screen, discussed and plan developed.  If using tobacco we discussed reduction/cessation and risk of relapse If current substance abuse, we discussed and referral to local resources was offered.   2. Infant care and feeding:  If breastfeeding, discussed returning to work, pumping, breastfeeding-associated pain, guidance regarding return to fertility while lactating if not using another method. If needed, patient was provided with a letter to be allowed to pump q 2-3hrs to support lactation in a private location with access to a refrigerator to store breastmilk.   Recommended that all caregivers be immunized for flu, pertussis and other preventable communicable diseases If pt does not have material needs met for her/baby, referred to local resources for help obtaining these.  3. Sexuality, contraception and birth spacing Provided guidance regarding sexuality, management  of dyspareunia, and resumption of intercourse Discussed avoiding interpregnancy interval <9m89m and recommended birth spacing of 18 months  4. Sleep and fatigue Discussed coping options for fatigue and sleep disruption Encouraged family/partner/community support of 4 hrs of uninterrupted sleep to help with mood and fatigue  5. Physical recovery  If pt had a C/S, assessed incisional pain and providing guidance on normal vs prolonged recovery If pt had a laceration, perineal healing and pain reviewed.  If urinary or fecal incontinence, discussed management and referred to PT or uro/gyn if indicated  Patient is safe to resume physical activity. Discussed attainment of healthy weight.  6.  Chronic disease management Discussed pregnancy complications if any, and their implications for future childbearing and long-term maternal health. Review recommendations for prevention of recurrent pregnancy complications, such as 17 hydroxyprogesterone caproate to reduce  risk for recurrent PTB not applicable, or aspirin to reduce risk of preeclampsia: does not plan future pregnancy. Pt had GDM: yes. If yes, 2hr GTT scheduled: yes. Reviewed medications and non-pregnant dosing including consideration of whether pt is breastfeeding using a reliable resource such as LactMed: yes Referred for f/u w/ PCP or subspecialist providers as indicated: not applicable  7. Health maintenance Mammogram at 33yo or earlier if indicated Pap smears as indicated  Meds:  Meds ordered this encounter  Medications   Drospirenone (SLYND) 4 MG TABS    Sig: Take 1 tablet by mouth daily.    Dispense:  90 tablet    Refill:  3    Order Specific Question:   Supervising Provider    Answer:   Florian Buff [[id number]]    Follow-up: Return in about 1 year (around 10/31/2022) for Physical.   No orders of the defined types were placed in this encounter.   [place], [facility] 10/30/2021 9:37 AM

## 2021-10-30 NOTE — Patient Instructions (Signed)
Tips To Increase Milk Supply Lots of water! Enough so that your urine is clear Plenty of calories, if you're not getting enough calories, your milk supply can decrease Breastfeed/pump often, every 2-3 hours x 20-30mins Fenugreek 3 pills 3 times a day, this may make your urine smell like maple syrup Mother's Milk Tea Lactation cookies, google for the recipe Real oatmeal Body Armor sports drinks Greater Than hydration drink  

## 2021-10-31 ENCOUNTER — Other Ambulatory Visit: Payer: Self-pay | Admitting: Women's Health

## 2021-10-31 DIAGNOSIS — Z8632 Personal history of gestational diabetes: Secondary | ICD-10-CM

## 2021-10-31 DIAGNOSIS — E7439 Other disorders of intestinal carbohydrate absorption: Secondary | ICD-10-CM

## 2021-10-31 LAB — GLUCOSE TOLERANCE, 2 HOURS W/ 1HR
Glucose, 1 hour: 194 mg/dL — ABNORMAL HIGH (ref 70–179)
Glucose, 2 hour: 150 mg/dL (ref 70–152)
Glucose, Fasting: 78 mg/dL (ref 70–91)

## 2021-11-30 NOTE — Progress Notes (Signed)
This encounter was created in error - please disregard.

## 2021-12-04 ENCOUNTER — Encounter: Payer: Self-pay | Admitting: Women's Health

## 2021-12-04 ENCOUNTER — Encounter: Payer: Self-pay | Admitting: *Deleted

## 2022-01-18 ENCOUNTER — Encounter: Payer: Self-pay | Admitting: Women's Health

## 2022-09-20 ENCOUNTER — Other Ambulatory Visit: Payer: Self-pay | Admitting: Women's Health

## 2023-05-21 ENCOUNTER — Other Ambulatory Visit: Payer: Self-pay | Admitting: Adult Health

## 2023-09-19 IMAGING — US US MFM FETAL BPP W/O NON-STRESS
1 series · 14 of 28 positions shown · non-contrast
Comparison: none

[Series 1: us mfm fetal bpp w/o non-stress · 35 acquisitions, 14 frames shown]
[im 2/35]
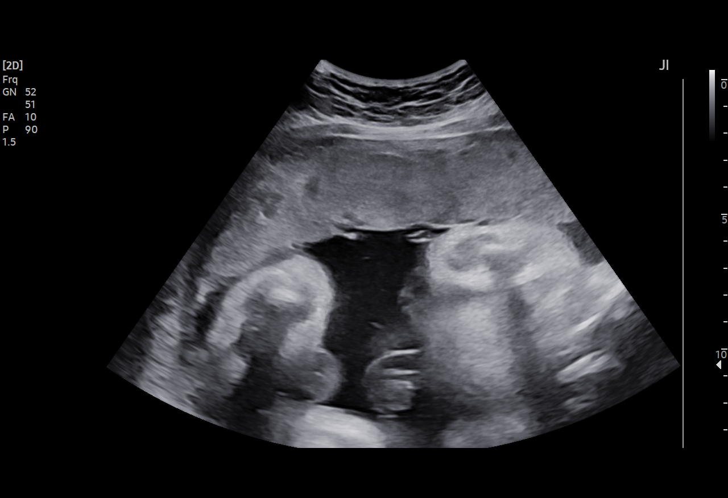
[im 4/35]
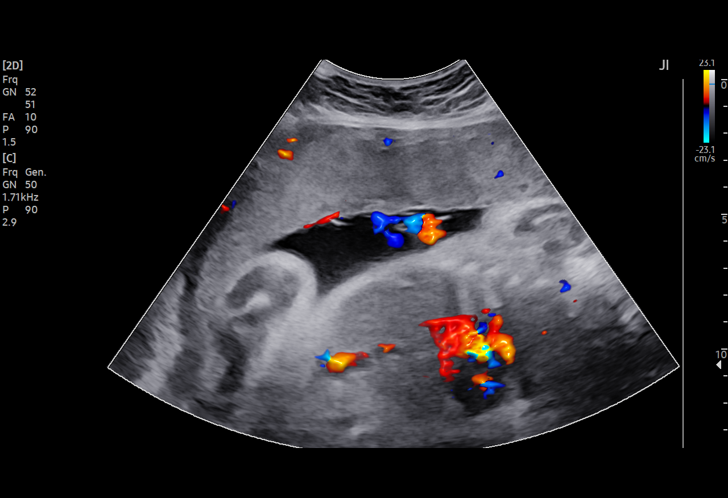
[im 7/35]
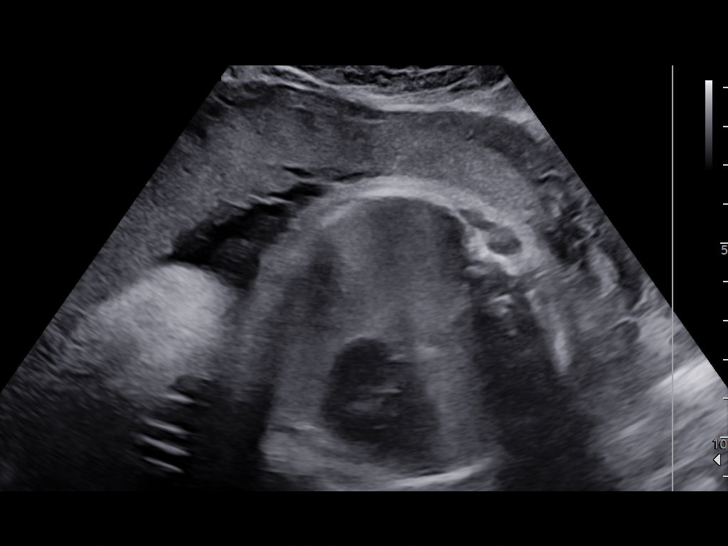
[im 9/35]
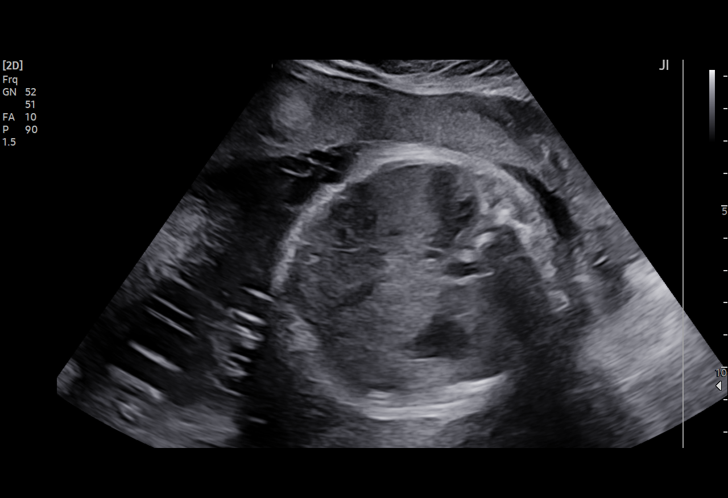
[im 12/35]
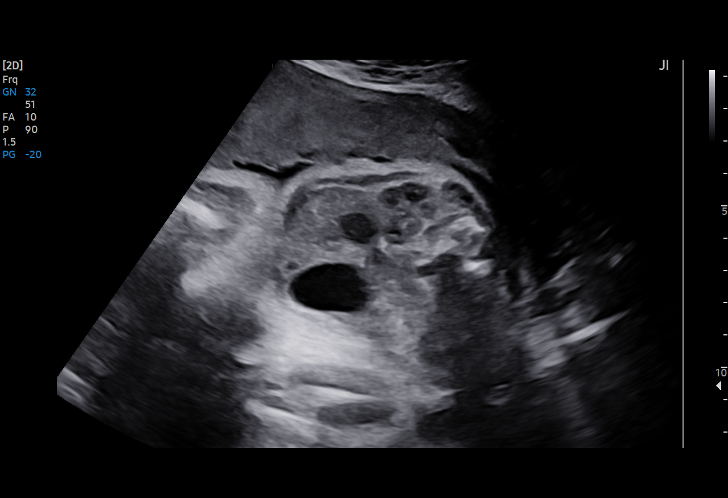
[im 14/35]
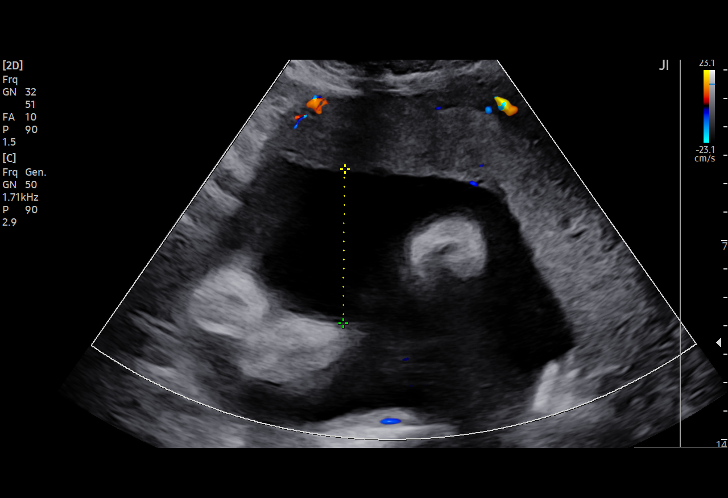
[im 17/35]
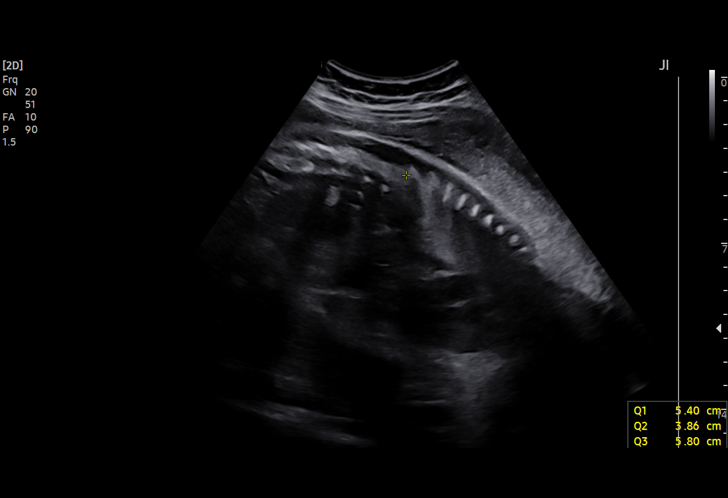
[im 19/35]
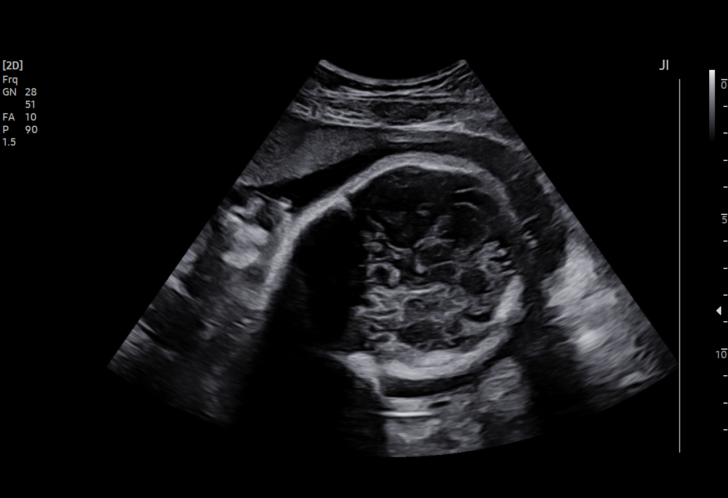
[im 22/35]
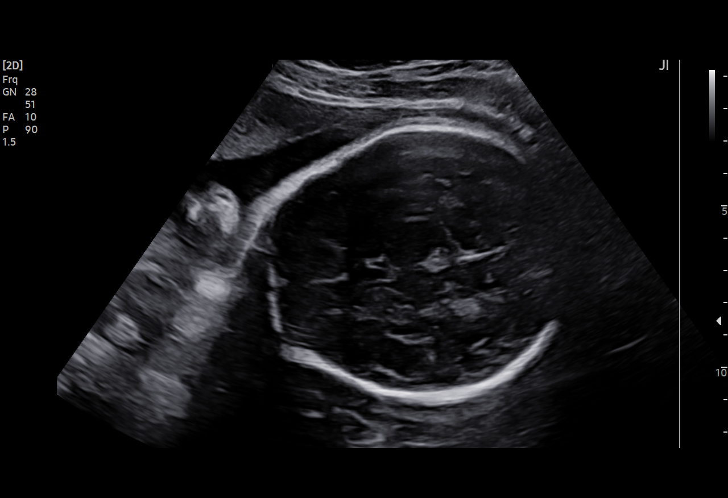
[im 24/35]
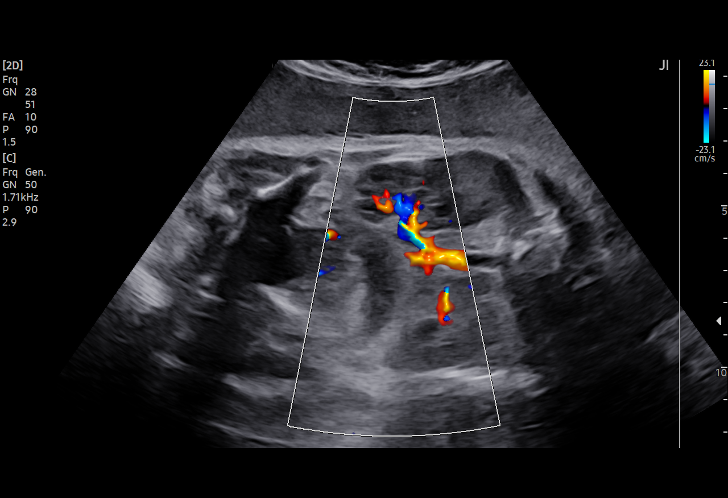
[im 27/35]
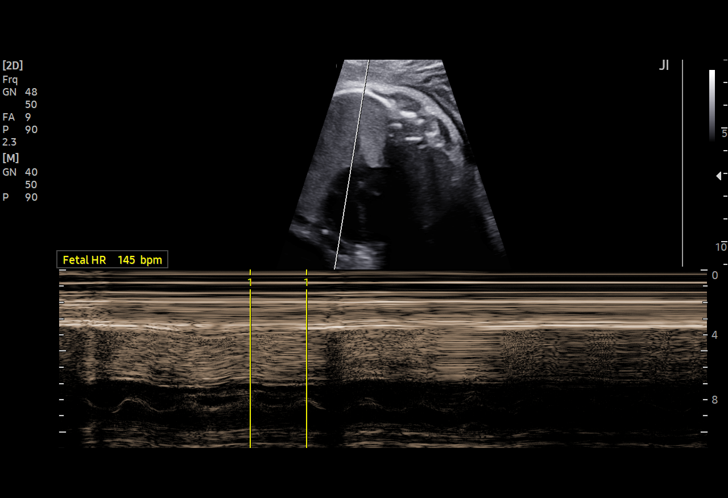
[im 29/35]
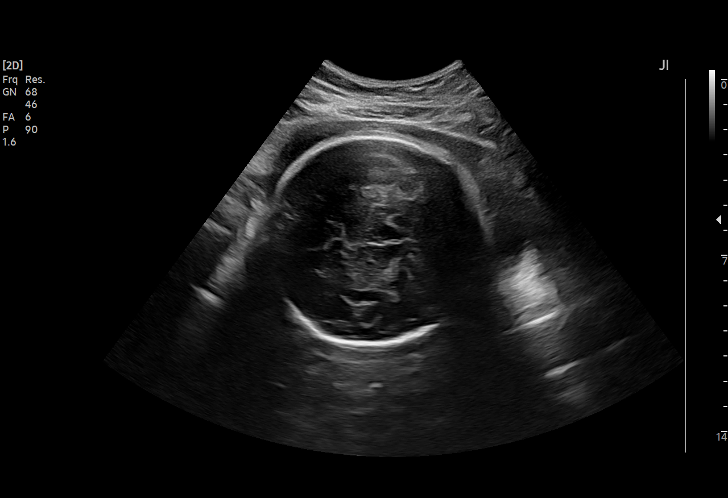
[im 32/35]
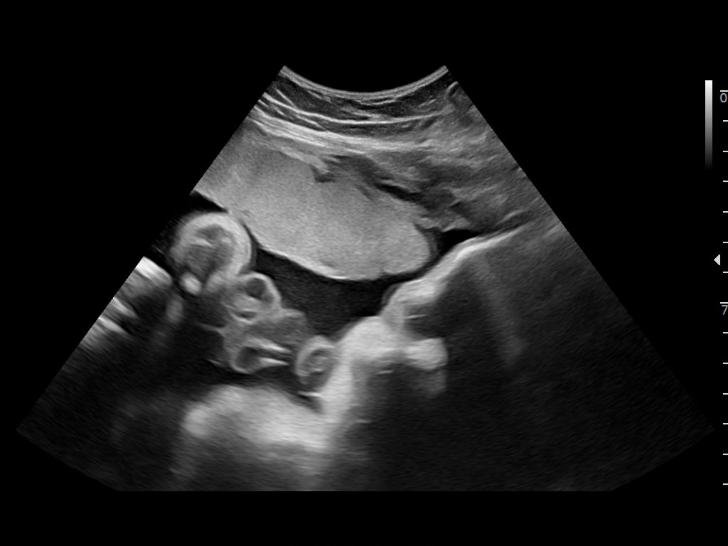
[im 35/35]
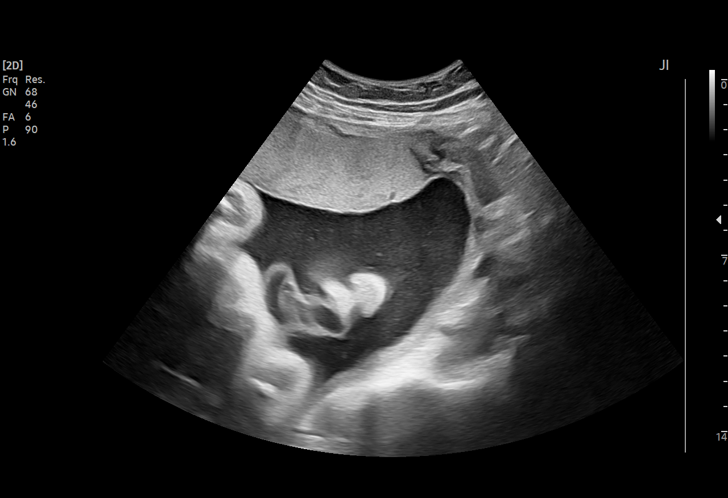

[14 of 28 positions shown; findings below may reference images not displayed]

OB
                   FOD[facility] CNM

Indications

 Pre-existing diabetes, type 2, in pregnancy,
 third trimester
 Obesity complicating pregnancy, third
 trimester (BMI 31.8)
 32 weeks gestation of pregnancy
 History of cesarean delivery, currently
 pregnant
Fetal Evaluation

 Num Of Fetuses:         1
 Fetal Heart Rate(bpm):  136
 Cardiac Activity:       Observed
 Presentation:           Cephalic
 Placenta:               Anterior

 Amniotic Fluid
 AFI FV:      Within normal limits

 AFI Sum(cm)     %Tile       Largest Pocket(cm)
 15.06           53

 RUQ(cm)       RLQ(cm)       LUQ(cm)        LLQ(cm)
 5.4           0
Biophysical Evaluation

 Amniotic F.V:   Within normal limits       F. Tone:        Observed
 F. Movement:    Observed                   Score:          [DATE]
 F. Breathing:   Observed
OB History

 Gravidity:    3         Term:   2         SAB:   1
 Living:       1
Gestational Age

 Best:          32w 2d     Det. By:  Early Ultrasound         EDD:   09/27/21
                                     (02/01/21)
Anatomy

 Cranium:               Appears normal         Stomach:                Appears normal, left
                                                                       sided
 Cavum:                 Appears normal         Abdominal Wall:         Appears nml (cord
                                                                       insert, abd wall)
 Ventricles:            Appears normal         Cord Vessels:           Appears normal (3
                                                                       vessel cord)
 Cerebellum:            Appears normal         Kidneys:                Appear normal
 Posterior Fossa:       Not well visualized    Bladder:                Appears normal
Comments

 This patient was seen for a BPP due to pregestational
 diabetes that is treated with metformin.  She is having the
 BPP performed in our office as she was unable to have the
 test performed in your office this week.
 She denies any other problems in her current pregnancy.
 A biophysical profile performed today was [DATE].
 There was normal amniotic fluid noted on today's ultrasound
 exam.
 The patient should continue to have weekly BPP's performed
 in your office until delivery.
 No further exams were scheduled in our office.
